# Patient Record
Sex: Female | Born: 1935 | Race: White | Hispanic: No | Smoking: Never smoker
Health system: Southern US, Community
[De-identification: ages and names within clinical notes are randomized; demographics above are authoritative.]

## PROBLEM LIST (undated history)

## (undated) DIAGNOSIS — J209 Acute bronchitis, unspecified: Secondary | ICD-10-CM

## (undated) DIAGNOSIS — K5900 Constipation, unspecified: Secondary | ICD-10-CM

## (undated) DIAGNOSIS — L03119 Cellulitis of unspecified part of limb: Secondary | ICD-10-CM

## (undated) DIAGNOSIS — J309 Allergic rhinitis, unspecified: Secondary | ICD-10-CM

## (undated) DIAGNOSIS — I10 Essential (primary) hypertension: Secondary | ICD-10-CM

## (undated) DIAGNOSIS — R32 Unspecified urinary incontinence: Secondary | ICD-10-CM

## (undated) DIAGNOSIS — M810 Age-related osteoporosis without current pathological fracture: Secondary | ICD-10-CM

## (undated) DIAGNOSIS — K219 Gastro-esophageal reflux disease without esophagitis: Secondary | ICD-10-CM

## (undated) DIAGNOSIS — K279 Peptic ulcer, site unspecified, unspecified as acute or chronic, without hemorrhage or perforation: Secondary | ICD-10-CM

## (undated) DIAGNOSIS — R42 Dizziness and giddiness: Secondary | ICD-10-CM

## (undated) DIAGNOSIS — R011 Cardiac murmur, unspecified: Secondary | ICD-10-CM

## (undated) DIAGNOSIS — I214 Non-ST elevation (NSTEMI) myocardial infarction: Secondary | ICD-10-CM

## (undated) DIAGNOSIS — G4700 Insomnia, unspecified: Secondary | ICD-10-CM

## (undated) DIAGNOSIS — R609 Edema, unspecified: Secondary | ICD-10-CM

## (undated) DIAGNOSIS — I2510 Atherosclerotic heart disease of native coronary artery without angina pectoris: Secondary | ICD-10-CM

## (undated) DIAGNOSIS — R05 Cough: Secondary | ICD-10-CM

## (undated) DIAGNOSIS — R4182 Altered mental status, unspecified: Secondary | ICD-10-CM

## (undated) DIAGNOSIS — J019 Acute sinusitis, unspecified: Secondary | ICD-10-CM

## (undated) DIAGNOSIS — S22009A Unspecified fracture of unspecified thoracic vertebra, initial encounter for closed fracture: Secondary | ICD-10-CM

## (undated) DIAGNOSIS — R0602 Shortness of breath: Secondary | ICD-10-CM

## (undated) DIAGNOSIS — M546 Pain in thoracic spine: Secondary | ICD-10-CM

## (undated) DIAGNOSIS — M1990 Unspecified osteoarthritis, unspecified site: Secondary | ICD-10-CM

## (undated) DIAGNOSIS — J45909 Unspecified asthma, uncomplicated: Secondary | ICD-10-CM

## (undated) DIAGNOSIS — I4891 Unspecified atrial fibrillation: Secondary | ICD-10-CM

## (undated) DIAGNOSIS — R059 Cough, unspecified: Secondary | ICD-10-CM

## (undated) DIAGNOSIS — L02419 Cutaneous abscess of limb, unspecified: Secondary | ICD-10-CM

## (undated) DIAGNOSIS — N390 Urinary tract infection, site not specified: Secondary | ICD-10-CM

## (undated) HISTORY — DX: Unspecified urinary incontinence: R32

## (undated) HISTORY — DX: Cough, unspecified: R05.9

## (undated) HISTORY — DX: Cutaneous abscess of limb, unspecified: L02.419

## (undated) HISTORY — DX: Cardiac murmur, unspecified: R01.1

## (undated) HISTORY — DX: Cough: R05

## (undated) HISTORY — DX: Unspecified asthma, uncomplicated: J45.909

## (undated) HISTORY — DX: Age-related osteoporosis without current pathological fracture: M81.0

## (undated) HISTORY — DX: Gastro-esophageal reflux disease without esophagitis: K21.9

## (undated) HISTORY — DX: Non-ST elevation (NSTEMI) myocardial infarction: I21.4

## (undated) HISTORY — DX: Urinary tract infection, site not specified: N39.0

## (undated) HISTORY — DX: Allergic rhinitis, unspecified: J30.9

## (undated) HISTORY — DX: Unspecified atrial fibrillation: I48.91

## (undated) HISTORY — DX: Atherosclerotic heart disease of native coronary artery without angina pectoris: I25.10

## (undated) HISTORY — DX: Essential (primary) hypertension: I10

## (undated) HISTORY — DX: Peptic ulcer, site unspecified, unspecified as acute or chronic, without hemorrhage or perforation: K27.9

## (undated) HISTORY — DX: Acute sinusitis, unspecified: J01.90

## (undated) HISTORY — DX: Unspecified osteoarthritis, unspecified site: M19.90

## (undated) HISTORY — DX: Cellulitis of unspecified part of limb: L03.119

## (undated) HISTORY — DX: Insomnia, unspecified: G47.00

## (undated) HISTORY — DX: Edema, unspecified: R60.9

## (undated) HISTORY — PX: BREAST LUMPECTOMY: SHX2

## (undated) HISTORY — DX: Shortness of breath: R06.02

## (undated) HISTORY — DX: Pain in thoracic spine: M54.6

## (undated) HISTORY — DX: Dizziness and giddiness: R42

## (undated) HISTORY — DX: Unspecified fracture of unspecified thoracic vertebra, initial encounter for closed fracture: S22.009A

## (undated) HISTORY — PX: SHOULDER SURGERY: SHX246

## (undated) HISTORY — DX: Altered mental status, unspecified: R41.82

## (undated) HISTORY — DX: Acute bronchitis, unspecified: J20.9

## (undated) HISTORY — DX: Constipation, unspecified: K59.00

## (undated) HISTORY — PX: OTHER SURGICAL HISTORY: SHX169

---

## 2002-04-10 HISTORY — PX: COLONOSCOPY: SHX174

## 2005-06-22 ENCOUNTER — Ambulatory Visit: Payer: Self-pay | Admitting: Family Medicine

## 2005-07-20 ENCOUNTER — Ambulatory Visit: Payer: Self-pay | Admitting: Internal Medicine

## 2005-11-09 ENCOUNTER — Encounter: Admission: RE | Admit: 2005-11-09 | Discharge: 2005-11-09 | Payer: Self-pay | Admitting: Family Medicine

## 2005-12-07 ENCOUNTER — Ambulatory Visit: Payer: Self-pay | Admitting: Family Medicine

## 2006-11-12 ENCOUNTER — Encounter: Admission: RE | Admit: 2006-11-12 | Discharge: 2006-11-12 | Payer: Self-pay | Admitting: Family Medicine

## 2006-11-14 ENCOUNTER — Encounter: Payer: Self-pay | Admitting: Family Medicine

## 2006-11-19 DIAGNOSIS — M810 Age-related osteoporosis without current pathological fracture: Secondary | ICD-10-CM | POA: Insufficient documentation

## 2006-11-19 DIAGNOSIS — K219 Gastro-esophageal reflux disease without esophagitis: Secondary | ICD-10-CM

## 2006-12-11 ENCOUNTER — Ambulatory Visit: Payer: Self-pay | Admitting: Family Medicine

## 2006-12-12 LAB — CONVERTED CEMR LAB
Basophils Relative: 0.5 % (ref 0.0–1.0)
CO2: 31 meq/L (ref 19–32)
Calcium: 9.5 mg/dL (ref 8.4–10.5)
Chloride: 106 meq/L (ref 96–112)
GFR calc non Af Amer: 49 mL/min
HCT: 34.3 % — ABNORMAL LOW (ref 36.0–46.0)
Hemoglobin: 11.8 g/dL — ABNORMAL LOW (ref 12.0–15.0)
LDL Cholesterol: 110 mg/dL — ABNORMAL HIGH (ref 0–99)
Monocytes Relative: 9.1 % (ref 3.0–11.0)
Neutro Abs: 4.9 10*3/uL (ref 1.4–7.7)
Neutrophils Relative %: 68.6 % (ref 43.0–77.0)
Platelets: 313 10*3/uL (ref 150–400)
Potassium: 4.6 meq/L (ref 3.5–5.1)
RDW: 14 % (ref 11.5–14.6)
Sodium: 142 meq/L (ref 135–145)
TSH: 4.01 microintl units/mL (ref 0.35–5.50)
Triglycerides: 115 mg/dL (ref 0–149)

## 2007-03-05 ENCOUNTER — Telehealth: Payer: Self-pay | Admitting: Family Medicine

## 2007-03-06 ENCOUNTER — Ambulatory Visit: Payer: Self-pay | Admitting: Family Medicine

## 2007-03-06 DIAGNOSIS — K279 Peptic ulcer, site unspecified, unspecified as acute or chronic, without hemorrhage or perforation: Secondary | ICD-10-CM | POA: Insufficient documentation

## 2007-03-06 DIAGNOSIS — L02419 Cutaneous abscess of limb, unspecified: Secondary | ICD-10-CM | POA: Insufficient documentation

## 2007-03-06 DIAGNOSIS — R609 Edema, unspecified: Secondary | ICD-10-CM

## 2007-03-06 DIAGNOSIS — L03119 Cellulitis of unspecified part of limb: Secondary | ICD-10-CM

## 2007-03-06 DIAGNOSIS — M1990 Unspecified osteoarthritis, unspecified site: Secondary | ICD-10-CM | POA: Insufficient documentation

## 2007-06-28 ENCOUNTER — Telehealth: Payer: Self-pay | Admitting: Family Medicine

## 2007-09-10 ENCOUNTER — Ambulatory Visit: Payer: Self-pay | Admitting: Family Medicine

## 2007-09-10 DIAGNOSIS — M546 Pain in thoracic spine: Secondary | ICD-10-CM

## 2007-11-27 ENCOUNTER — Encounter: Admission: RE | Admit: 2007-11-27 | Discharge: 2007-11-27 | Payer: Self-pay | Admitting: Family Medicine

## 2007-12-12 ENCOUNTER — Ambulatory Visit: Payer: Self-pay | Admitting: Family Medicine

## 2007-12-12 DIAGNOSIS — I10 Essential (primary) hypertension: Secondary | ICD-10-CM | POA: Insufficient documentation

## 2007-12-17 LAB — CONVERTED CEMR LAB
ALT: 11 units/L (ref 0–35)
AST: 19 units/L (ref 0–37)
Albumin: 3.9 g/dL (ref 3.5–5.2)
BUN: 20 mg/dL (ref 6–23)
Basophils Absolute: 0 10*3/uL (ref 0.0–0.1)
CO2: 30 meq/L (ref 19–32)
Calcium: 9.4 mg/dL (ref 8.4–10.5)
Chloride: 105 meq/L (ref 96–112)
Cholesterol: 199 mg/dL (ref 0–200)
Creatinine, Ser: 1.2 mg/dL (ref 0.4–1.2)
Eosinophils Relative: 1 % (ref 0.0–5.0)
GFR calc Af Amer: 54 mL/min
HDL: 66 mg/dL (ref >39.0)
MCV: 87.8 fL (ref 78.0–100.0)
Monocytes Relative: 10.5 % (ref 3.0–12.0)
Neutrophils Relative %: 63.9 % (ref 43.0–77.0)
RDW: 13.2 % (ref 11.5–14.6)
TSH: 3.27 microintl units/mL (ref 0.35–5.50)
VLDL: 12 mg/dL (ref 0–40)

## 2008-01-04 ENCOUNTER — Emergency Department (HOSPITAL_COMMUNITY): Admission: EM | Admit: 2008-01-04 | Discharge: 2008-01-04 | Payer: Self-pay | Admitting: Emergency Medicine

## 2008-01-04 ENCOUNTER — Telehealth (INDEPENDENT_AMBULATORY_CARE_PROVIDER_SITE_OTHER): Payer: Self-pay | Admitting: *Deleted

## 2008-01-06 ENCOUNTER — Telehealth: Payer: Self-pay | Admitting: Family Medicine

## 2008-01-13 ENCOUNTER — Ambulatory Visit: Payer: Self-pay | Admitting: Family Medicine

## 2008-01-13 DIAGNOSIS — R0602 Shortness of breath: Secondary | ICD-10-CM

## 2008-01-27 ENCOUNTER — Ambulatory Visit: Payer: Self-pay | Admitting: Family Medicine

## 2008-01-27 DIAGNOSIS — J209 Acute bronchitis, unspecified: Secondary | ICD-10-CM

## 2008-01-31 ENCOUNTER — Telehealth: Payer: Self-pay | Admitting: Family Medicine

## 2008-02-04 ENCOUNTER — Ambulatory Visit: Payer: Self-pay | Admitting: Family Medicine

## 2008-02-04 DIAGNOSIS — R42 Dizziness and giddiness: Secondary | ICD-10-CM | POA: Insufficient documentation

## 2008-02-04 DIAGNOSIS — J019 Acute sinusitis, unspecified: Secondary | ICD-10-CM | POA: Insufficient documentation

## 2008-03-20 ENCOUNTER — Ambulatory Visit: Payer: Self-pay | Admitting: Family Medicine

## 2008-03-20 ENCOUNTER — Encounter: Payer: Self-pay | Admitting: Family Medicine

## 2008-03-20 DIAGNOSIS — S22009A Unspecified fracture of unspecified thoracic vertebra, initial encounter for closed fracture: Secondary | ICD-10-CM | POA: Insufficient documentation

## 2008-03-23 ENCOUNTER — Encounter: Admission: RE | Admit: 2008-03-23 | Discharge: 2008-03-23 | Payer: Self-pay | Admitting: Family Medicine

## 2008-03-31 ENCOUNTER — Encounter: Payer: Self-pay | Admitting: Family Medicine

## 2008-04-10 HISTORY — PX: VERTEBROPLASTY: SHX113

## 2008-04-13 ENCOUNTER — Encounter: Payer: Self-pay | Admitting: Family Medicine

## 2008-04-14 ENCOUNTER — Ambulatory Visit (HOSPITAL_COMMUNITY): Admission: RE | Admit: 2008-04-14 | Discharge: 2008-04-15 | Payer: Self-pay | Admitting: Neurosurgery

## 2008-06-09 ENCOUNTER — Ambulatory Visit: Payer: Self-pay | Admitting: Family Medicine

## 2008-06-09 DIAGNOSIS — J309 Allergic rhinitis, unspecified: Secondary | ICD-10-CM | POA: Insufficient documentation

## 2008-06-09 DIAGNOSIS — G4700 Insomnia, unspecified: Secondary | ICD-10-CM | POA: Insufficient documentation

## 2008-07-20 ENCOUNTER — Telehealth: Payer: Self-pay | Admitting: Family Medicine

## 2008-07-22 ENCOUNTER — Encounter: Payer: Self-pay | Admitting: Family Medicine

## 2008-07-24 ENCOUNTER — Emergency Department (HOSPITAL_COMMUNITY): Admission: EM | Admit: 2008-07-24 | Discharge: 2008-07-24 | Payer: Self-pay | Admitting: Emergency Medicine

## 2008-07-24 ENCOUNTER — Ambulatory Visit: Payer: Self-pay | Admitting: Family Medicine

## 2008-07-24 LAB — CONVERTED CEMR LAB
CO2: 28 meq/L (ref 19–32)
GFR calc non Af Amer: 29.74 mL/min (ref >60)
Glucose, Bld: 97 mg/dL (ref 70–99)
Sodium: 143 meq/L (ref 135–145)

## 2008-07-27 ENCOUNTER — Encounter: Admission: RE | Admit: 2008-07-27 | Discharge: 2008-07-27 | Payer: Self-pay | Admitting: Ophthalmology

## 2008-09-01 ENCOUNTER — Ambulatory Visit: Payer: Self-pay | Admitting: Family Medicine

## 2008-09-01 DIAGNOSIS — K5900 Constipation, unspecified: Secondary | ICD-10-CM | POA: Insufficient documentation

## 2008-09-16 ENCOUNTER — Ambulatory Visit: Payer: Self-pay | Admitting: Family Medicine

## 2008-09-16 DIAGNOSIS — R32 Unspecified urinary incontinence: Secondary | ICD-10-CM | POA: Insufficient documentation

## 2008-10-09 ENCOUNTER — Ambulatory Visit: Payer: Self-pay | Admitting: Family Medicine

## 2008-12-01 ENCOUNTER — Encounter: Admission: RE | Admit: 2008-12-01 | Discharge: 2008-12-01 | Payer: Self-pay | Admitting: Family Medicine

## 2008-12-18 ENCOUNTER — Telehealth: Payer: Self-pay | Admitting: Family Medicine

## 2009-01-19 ENCOUNTER — Ambulatory Visit: Payer: Self-pay | Admitting: Family Medicine

## 2009-01-19 DIAGNOSIS — J45909 Unspecified asthma, uncomplicated: Secondary | ICD-10-CM | POA: Insufficient documentation

## 2009-01-20 LAB — CONVERTED CEMR LAB
ALT: 10 units/L (ref 0–35)
AST: 17 units/L (ref 0–37)
Albumin: 3.7 g/dL (ref 3.5–5.2)
Alkaline Phosphatase: 53 units/L (ref 39–117)
BUN: 25 mg/dL — ABNORMAL HIGH (ref 6–23)
Basophils Relative: 0.7 % (ref 0.0–3.0)
Calcium: 9.3 mg/dL (ref 8.4–10.5)
Eosinophils Relative: 1.3 % (ref 0.0–5.0)
GFR calc non Af Amer: 37.17 mL/min (ref >60)
Glucose, Bld: 87 mg/dL (ref 70–99)
HCT: 32.7 % — ABNORMAL LOW (ref 36.0–46.0)
HDL: 57.7 mg/dL (ref >39.00)
Lymphocytes Relative: 19.8 % (ref 12.0–46.0)
Monocytes Relative: 9.2 % (ref 3.0–12.0)
Neutrophils Relative %: 69 % (ref 43.0–77.0)
RDW: 13.5 % (ref 11.5–14.6)
Sodium: 135 meq/L (ref 135–145)
Total Bilirubin: 0.7 mg/dL (ref 0.3–1.2)
Total Protein: 7 g/dL (ref 6.0–8.3)
Triglycerides: 59 mg/dL (ref 0.0–149.0)
VLDL: 11.8 mg/dL (ref 0.0–40.0)
WBC: 6.8 10*3/uL (ref 4.5–10.5)

## 2009-02-09 ENCOUNTER — Encounter (INDEPENDENT_AMBULATORY_CARE_PROVIDER_SITE_OTHER): Payer: Self-pay | Admitting: *Deleted

## 2009-03-27 ENCOUNTER — Emergency Department (HOSPITAL_COMMUNITY): Admission: EM | Admit: 2009-03-27 | Discharge: 2009-03-27 | Payer: Self-pay | Admitting: Emergency Medicine

## 2009-05-12 ENCOUNTER — Ambulatory Visit: Payer: Self-pay | Admitting: Family Medicine

## 2009-08-18 ENCOUNTER — Telehealth: Payer: Self-pay | Admitting: Family Medicine

## 2009-08-31 ENCOUNTER — Telehealth: Payer: Self-pay | Admitting: Family Medicine

## 2009-09-30 IMAGING — CR DG RIBS 2V*L*
3 series · 3 of 3 positions shown · non-contrast
Comparison: 01/04/2008 chest radiograph

CLINICAL DATA: Left side pain.  Fell 2 weeks ago.

LEFT RIBS - 2 VIEW

[view not recorded (1 of 3)]
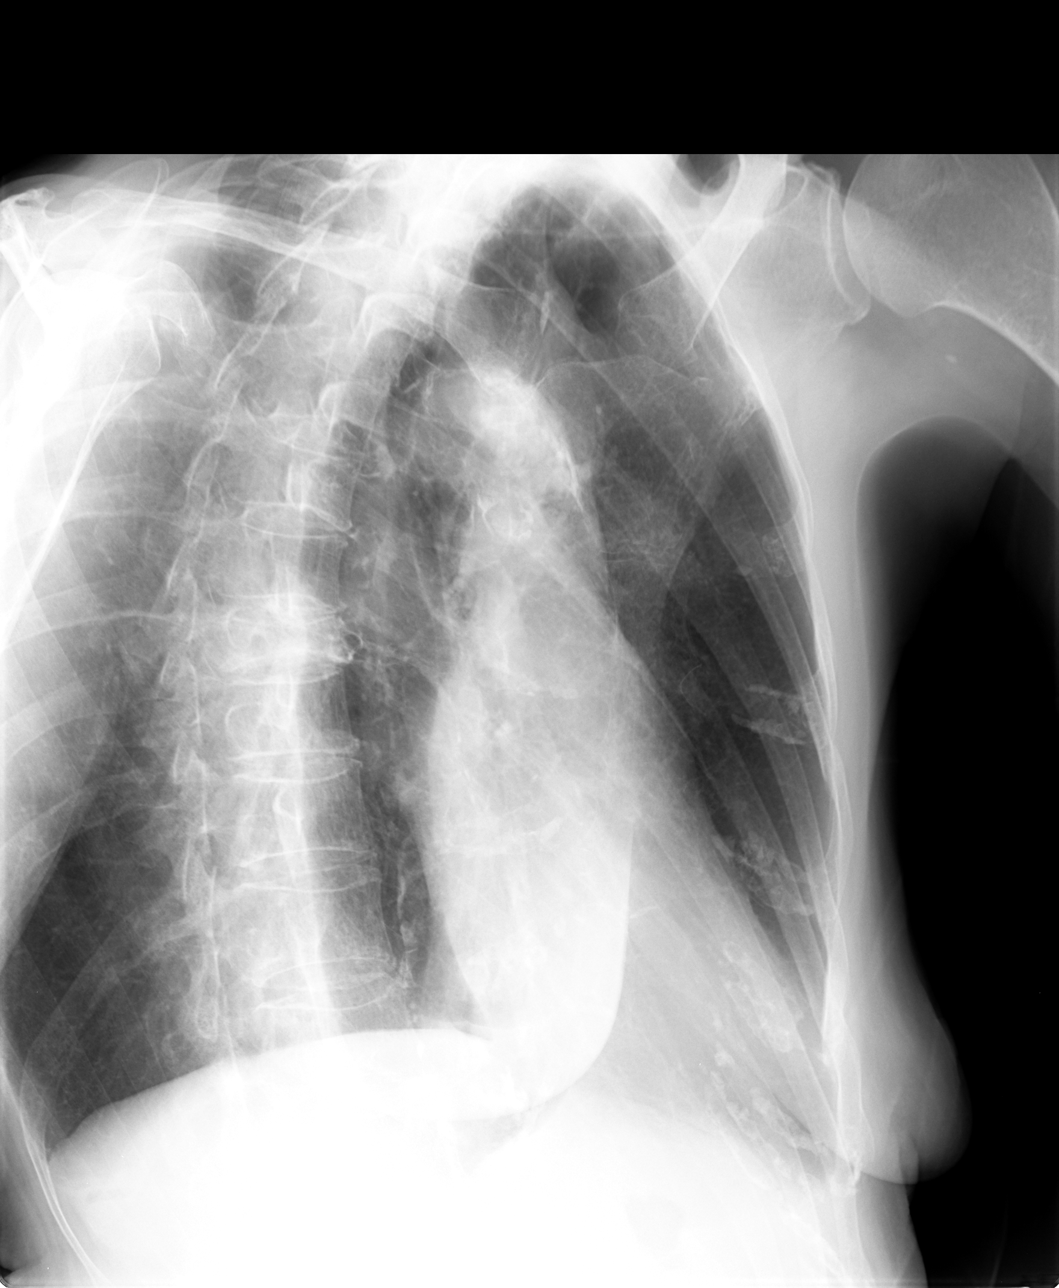

[view not recorded (2 of 3)]
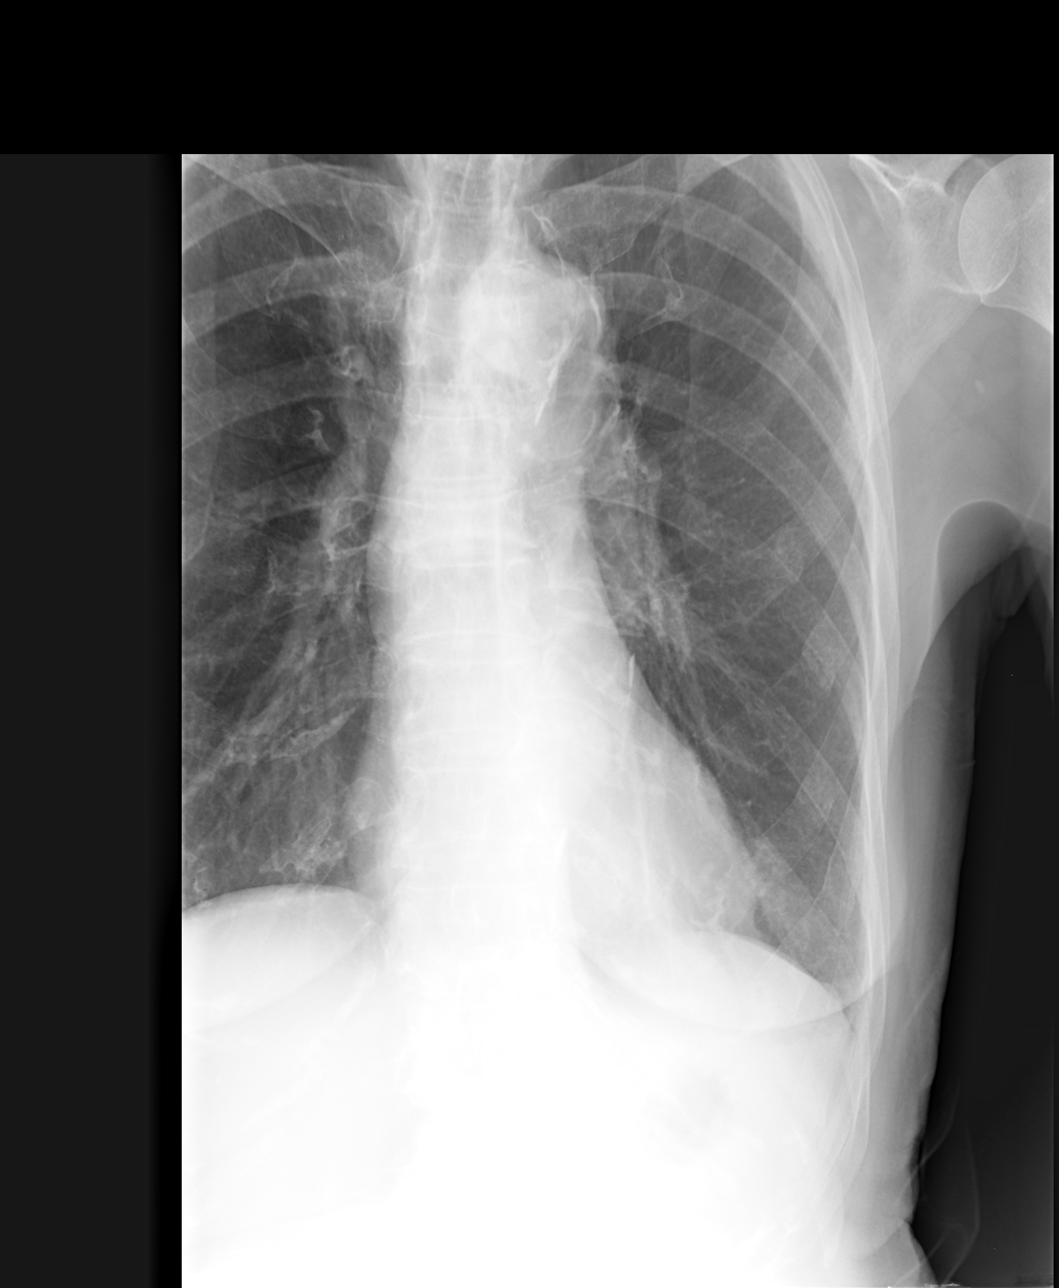

[view not recorded (3 of 3)]
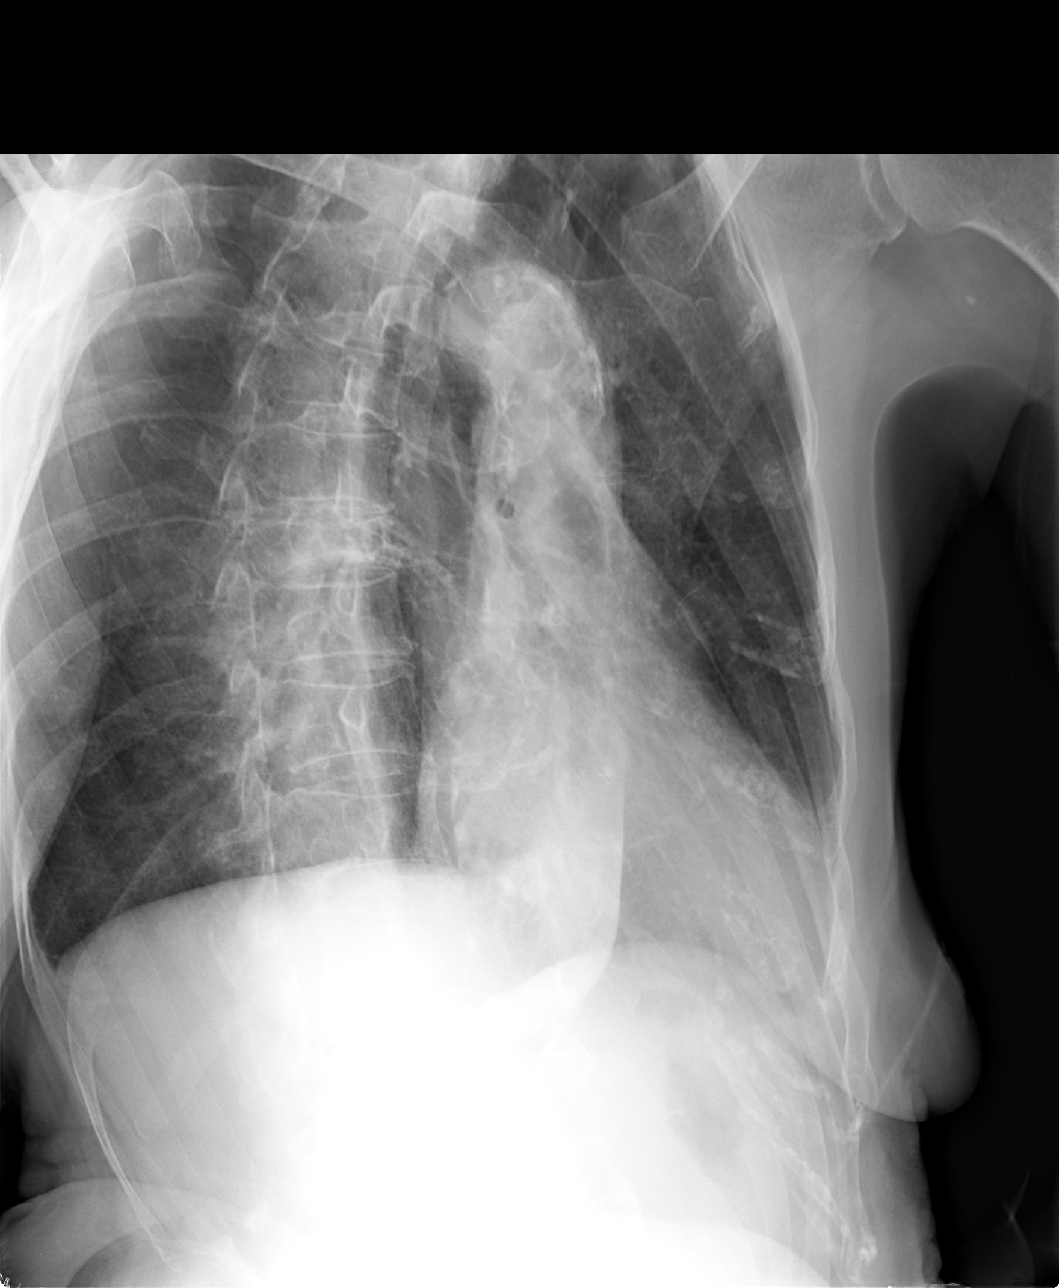

[3 of 3 positions shown; findings below may reference images not displayed]

FINDINGS: Multiple remote healing left rib fractures.  No acute
fracture.  T8 compression fracture represents a new finding.
IMPRESSION: Acute/subacute T8 compression fracture.  The vertebrae is
compressed down to approximately one half of its normal height.
Consider plain radiographs of the thoracic spine for further
evaluation.  Remote left rib fractures.  No acute rib fracture.

## 2009-10-06 ENCOUNTER — Telehealth: Payer: Self-pay | Admitting: Family Medicine

## 2009-10-26 ENCOUNTER — Telehealth: Payer: Self-pay | Admitting: Family Medicine

## 2009-10-30 ENCOUNTER — Ambulatory Visit: Payer: Self-pay | Admitting: Family Medicine

## 2009-10-30 DIAGNOSIS — N390 Urinary tract infection, site not specified: Secondary | ICD-10-CM

## 2009-10-30 LAB — CONVERTED CEMR LAB
Blood in Urine, dipstick: NEGATIVE
Glucose, Urine, Semiquant: NEGATIVE
Ketones, urine, test strip: NEGATIVE
Nitrite: NEGATIVE
Specific Gravity, Urine: <1.005
Urobilinogen, UA: 0.2
WBC Urine, dipstick: NEGATIVE
pH: 5

## 2009-11-01 ENCOUNTER — Telehealth (INDEPENDENT_AMBULATORY_CARE_PROVIDER_SITE_OTHER): Payer: Self-pay

## 2009-11-02 ENCOUNTER — Ambulatory Visit: Payer: Self-pay | Admitting: Family Medicine

## 2009-11-02 ENCOUNTER — Ambulatory Visit: Payer: Self-pay | Admitting: Cardiology

## 2009-11-02 ENCOUNTER — Inpatient Hospital Stay (HOSPITAL_COMMUNITY): Admission: EM | Admit: 2009-11-02 | Discharge: 2009-11-05 | Payer: Self-pay | Admitting: Emergency Medicine

## 2009-11-03 ENCOUNTER — Encounter: Payer: Self-pay | Admitting: Cardiology

## 2009-12-03 DIAGNOSIS — I4891 Unspecified atrial fibrillation: Secondary | ICD-10-CM | POA: Insufficient documentation

## 2009-12-03 DIAGNOSIS — I252 Old myocardial infarction: Secondary | ICD-10-CM | POA: Insufficient documentation

## 2009-12-03 DIAGNOSIS — R4182 Altered mental status, unspecified: Secondary | ICD-10-CM

## 2009-12-10 ENCOUNTER — Ambulatory Visit: Payer: Self-pay | Admitting: Cardiology

## 2009-12-14 LAB — CONVERTED CEMR LAB
CO2: 27 meq/L (ref 19–32)
Calcium: 9 mg/dL (ref 8.4–10.5)
Chloride: 97 meq/L (ref 96–112)
GFR calc non Af Amer: 48.5 mL/min (ref >60)

## 2009-12-31 ENCOUNTER — Telehealth: Payer: Self-pay | Admitting: Family Medicine

## 2010-01-05 ENCOUNTER — Encounter: Admission: RE | Admit: 2010-01-05 | Discharge: 2010-01-05 | Payer: Self-pay | Admitting: Family Medicine

## 2010-02-11 ENCOUNTER — Telehealth: Payer: Self-pay | Admitting: Family Medicine

## 2010-03-31 ENCOUNTER — Inpatient Hospital Stay (HOSPITAL_COMMUNITY)
Admission: EM | Admit: 2010-03-31 | Discharge: 2010-04-01 | Payer: Self-pay | Source: Home / Self Care | Attending: Cardiology | Admitting: Cardiology

## 2010-04-26 ENCOUNTER — Encounter (INDEPENDENT_AMBULATORY_CARE_PROVIDER_SITE_OTHER): Payer: Self-pay | Admitting: *Deleted

## 2010-04-27 ENCOUNTER — Ambulatory Visit
Admission: RE | Admit: 2010-04-27 | Discharge: 2010-04-27 | Payer: Self-pay | Source: Home / Self Care | Attending: Cardiology | Admitting: Cardiology

## 2010-04-27 ENCOUNTER — Encounter: Payer: Self-pay | Admitting: Cardiology

## 2010-04-27 DIAGNOSIS — R05 Cough: Secondary | ICD-10-CM | POA: Insufficient documentation

## 2010-04-27 DIAGNOSIS — I2510 Atherosclerotic heart disease of native coronary artery without angina pectoris: Secondary | ICD-10-CM | POA: Insufficient documentation

## 2010-05-05 NOTE — H&P (Addendum)
Debra Velasquez, PISCOPO NO.:  [id number]  MEDICAL RECORD NO.:  [id number]          PATIENT TYPE:  INP  LOCATION:  2037                         FACILITY:  [facility]  PHYSICIAN:  Colleen Can. Debra Velasquez, M.D.DATE OF BIRTH:  [birth year]  DATE OF ADMISSION:  03/31/2010 DATE OF DISCHARGE:                             HISTORY & PHYSICAL   PRIMARY CARDIOLOGIST:  Maisie Fus C. Daleen Squibb, MD, [facility]  PRIMARY CARE PHYSICIAN:  Tera Mater. Clent Ridges, MD  CHIEF COMPLAINT:  Chest pain.  HISTORY OF PRESENT ILLNESS:  This is a 77 year old female with a history of non-ST-elevation myocardial infarction in July 2011, paroxysmal atrial fibrillation, and hypertension who states she was in her usual state of health until last evening where she began to experience substernal chest pain that radiated to the mid scapula.  She denies any associated symptoms of nausea, vomiting, or dyspnea.  The patient tried to relieve the pain with sublingual nitroglycerin this morning and this did not aide in her discomfort.  She presented to the emergency department for further evaluation.  The patient's pain has now been present for more than 12 hours.  The patient's initial set of cardiac markers in the emergency department were negative.  She also has EKG without acute changes.  Of note, the patient does exercise regularly without discomfort or increased dyspnea.  She has had no change in her routine.  She also has a history of gastroesophageal reflux disease, which she is on chronic Nexium therapy for.  In reviewing the patient's record, it appears that the patient was admitted for hyponatremia actually in July and was taken off her hydrochlorothiazide at this time and that was felt to be the culprit. Her recent an outpatient medication list does contain hydrochlorothiazide/lisinopril.  Again, her hydrochlorothiazide will be held.  PAST MEDICAL HISTORY: 1. Non-ST-segment elevation myocardial infarction in July 2011.     a.     Echocardiogram in July 2011 demonstrating no regional wall      motion abnormalities, but ejection fraction of 55% to 60%. 2. Paroxysmal atrial fibrillation.  Considered not a good candidate     for Coumadin therapy and is on aspirin therapy. 3. Hypertension. 4. Gastroesophageal reflux disease. 5. Asthma. 6. Osteoarthritis. 7. Osteoporosis. 8. Peptic ulcer disease. 9. Recurrent urinary tract infections. 10.Urinary incontinence. 11.Status post shoulder surgery. 12.Status post lumpectomy. 13.Status post ureteral obstruction surgery. 14.Status post colonoscopy in 2004. 15.Status post vertebroplasty.  SOCIAL HISTORY:  The patient lives in assisted living facility in [place], [place].  The patient denies any tobacco, alcohol, or illicit drug use.  She tries to walk regularly.  FAMILY HISTORY:  Noncontributory secondary to the patient's age.  ALLERGIES:  No known drug allergies.  OUTPATIENT MEDICATIONS: 1. Vesicare 5 mg 1 tablet daily. 2. Nasonex 15 mcg 2 sprays each nostril daily as needed. 3. Lisinopril/hydrochlorothiazide 20/12.5 one tablet daily. 4. Vicodin 5/500 one tablet every 6 hours as needed. 5. Crestor 10 mg 1 tablet every evening. 6. Nitroglycerin 0.4 mg sublingual as needed for chest pain. 7. Nexium 40 mg 1 tablet daily. 8. Aspirin 81 mg daily. 9. Fosamax 35 mg 1 tablet  weekly. 10.Metoprolol-XL succinate 50 mg 1 tablet daily.  REVIEW OF SYSTEMS:  All pertinent positives and negatives as stated in HPI.  All other systems have been reviewed and are negative.  PHYSICAL EXAMINATION:  VITAL SIGNS:  Temperature 98.2, pulse 61, respirations 16, blood pressure 143-183 over 62-94. GENERAL:  This is a polite elderly female.  She is in no acute distress. The patient is extremely alert for her age. HEENT:  Normal. NECK:  Supple without bruit or JVD. HEART:  Regular rate and rhythm with S1 and S2.  There is a harsh murmur noted.  Pulses are 2+ and equal  bilaterally. LUNGS:  Clear to auscultation bilaterally without wheezes, rales, or rhonchi. ABDOMEN:  Soft, nontender, positive bowel sounds x4. EXTREMITIES:  No clubbing, cyanosis, or edema. NEURO:  Alert and oriented x3, cranial nerves II through XII grossly intact.  Chest x-ray showing COPD, but no acute cardiopulmonary process.  EKG showed normal sinus rhythm at a rate of 72 beats per minute.  Axis is normal.  Intervals are normal.  There is left ventricular hypertrophy and that appears unchanged from previous tracings.  LABORATORY DATA:  WBC 5.9, hemoglobin 10.2, hematocrit 30.9, platelets 245.  Sodium 131, potassium 3.9, chloride 99, bicarb 27, BUN 16, creatinine 1.21.  Cardiac enzymes are negative x1.  ASSESSMENT/PLAN:  This is a 77 year old female with recent non-ST- elevation myocardial infarction, hypertension, and gastroesophageal reflux disease who presents with chest pain.  Symptoms appear typical and may be related to reflux.  We will initially continue conservative management, admit the patient to rule out for myocardial infarction by cycling cardiac enzymes.  If these remain negative, then no further invasive workup will be needed.  Question with the patient's chronic gastroesophageal reflux disease if there is a benefit to remain on Fosamax.  This should be discussed with her primary care physician.  The patient will be continued on her home medications.  Further treatment will be dependent upon these results.     Leonette Monarch, PA-C   ______________________________ Colleen Can Debra Velasquez, M.D.    NB/MEDQ  D:  03/31/2010  T:  04/01/2010  Job:  [id number]  Electronically Signed by Alen Blew P.A. on 04/19/2010 08:40:01 PM Electronically Signed by Roger Shelter M.D. on 05/05/2010 03:35:20 PM

## 2010-05-10 NOTE — Progress Notes (Signed)
Summary: new rx  Phone Note Call from Patient Call back at Home Phone [phone]   Caller: Daughter-doris Call For: Debra Salisbury MD Summary of Call: pt needs new rx temazepam cap 15 mg #90 fax to [facility] [phone] Initial call taken by: Heron Sabins,  Aug 31, 2009 2:19 PM  Follow-up for Phone Call        done, please fax Follow-up by: Debra Salisbury MD,  Aug 31, 2009 3:50 PM  Additional Follow-up for Phone Call Additional follow up Details #1::        faxed Additional Follow-up by: Raechel Ache, RN,  Aug 31, 2009 3:56 PM    Prescriptions: TEMAZEPAM 15 MG CAPS (TEMAZEPAM) at bedtime  #90 x 1   Entered and Authorized by:   Debra Salisbury MD   Signed by:   Debra Salisbury MD on 08/31/2009   Method used:   Print then Give to Patient   RxID:   [id number]

## 2010-05-10 NOTE — Progress Notes (Signed)
Summary: REFILL REQUEST nasonex  Phone Note Refill Request Message from:  Patient's daughter  [phone] on December 31, 2009 9:24 AM  Refills Requested: Medication #1:  NASONEX 50 MCG/ACT SUSP 2 sprays each nostril once daily   Notes: [facility].    Initial call taken by: Debbra Riding,  December 31, 2009 9:25 AM  Follow-up for Phone Call        done  daughter aware.  Follow-up by: Pura Spice, RN,  December 31, 2009 12:22 PM    New/Updated Medications: NASONEX 50 MCG/ACT SUSP (MOMETASONE FUROATE) 2 sprays each nostril once daily Prescriptions: NASONEX 50 MCG/ACT SUSP (MOMETASONE FUROATE) 2 sprays each nostril once daily  #3 x 3   Entered by:   Pura Spice, RN   Authorized by:   [name] MD   Signed by:   Pura Spice, RN on 12/31/2009   Method used:   Electronically to        [facility]* (retail)             ,          Ph: [phone]       Fax: [phone]   RxID:   [id number]

## 2010-05-10 NOTE — Assessment & Plan Note (Signed)
Summary: pt fell/njr   Vital Signs:  Patient profile:   77 year old female O2 Sat:      98 % on Room air Temp:     97.9 degrees F oral Pulse rate:   84 / minute Pulse rhythm:   irregularly irregular BP sitting:   120 / 68  (left arm) Cuff size:   regular  Vitals Entered By: Raechel Ache, RN (November 02, 2009 11:28 AM)  O2 Flow:  Room air CC: C/o feeling sick all over. Was seen at Sat clinic and treated with Cipro for UTI (hard time voiding). Last night was found on floor with nausea and vomiting.   History of Present Illness: 77 yr old female here with her son-in-law after she was found on the floor of her assisted living facility at 2:30 this morning. She had pushed a call help button, and the staff responded. She does not remember  falling however. Started feeling bad last night and continues to feel bad today. She is weak, shaky, and very nauseated, although she has not vomitted. She has a mild lower abdominal pain. No fever. No chest pain or SOB. She was seen last weekend for urinary urgency and burning, was found to have a UTI, and was given a 3 day course of Cipro. She did finish this, and in fact the urgency and burning went away.   Allergies: No Known Drug Allergies  Past History:  Past Medical History: GERD Osteoporosis, last DEXA 08-11-05 Osteoarthritis phlebitis Peptic ulcer disease UTI's heart murmur vertebral compression fractures Hypertension Asthma Urinary incontinence  Past Surgical History: Reviewed history from 01/19/2009 and no changes required. shoulder surgery Lumpectomy ureter obstruction surgery colonoscopy 2004, all normal vertebroplasty to T8 per Dr. Venetia Maxon 2010  Review of Systems  The patient denies anorexia, fever, weight loss, weight gain, vision loss, decreased hearing, hoarseness, chest pain, syncope, dyspnea on exertion, peripheral edema, prolonged cough, headaches, hemoptysis, melena, hematochezia, severe indigestion/heartburn, hematuria,  incontinence, genital sores, muscle weakness, suspicious skin lesions, transient blindness, difficulty walking, depression, unusual weight change, abnormal bleeding, enlarged lymph nodes, angioedema, and breast masses.    Physical Exam  General:  alert but very weak, in a wheelchair. Requires assistance to get on the exam table.  Neck:  No deformities, masses, or tenderness noted. Lungs:  Normal respiratory effort, chest expands symmetrically. Lungs are clear to auscultation, no crackles or wheezes. Heart:  irregularly irregular rhythm. normal rate, no murmur, no gallop, and no rub.  EKG shows new onset atrial fibrillation with a controlled ventricular rate. Long QTc interval. No acute ST changes.  Abdomen:  soft, normal bowel sounds, no distention, no masses, no guarding, no rigidity, no rebound tenderness, no abdominal hernia, no inguinal hernia, no hepatomegaly, and no splenomegaly.  Mildly tender above the pubis Pulses:  R and L carotid,radial,femoral,dorsalis pedis and posterior tibial pulses are full and equal bilaterally Extremities:  no edema. Neurologic:  alert & oriented X3 and cranial nerves II-XII intact.   Skin:  Intact without suspicious lesions or rashes Psych:  Oriented X3, normally interactive, and good eye contact.     Impression & Recommendations:  Problem # 1:  ATRIAL FIBRILLATION (ICD-427.31)  Her updated medication list for this problem includes:    Aspirin 81 Mg Tbec (Aspirin) ..... Once daily    Metoprolol Succinate 50 Mg Xr24h-tab (Metoprolol succinate) ..... Once daily  Orders: EKG w/ Interpretation (93000)  Problem # 2:  UTI (ICD-599.0)  Her updated medication list for this problem includes:  Vesicare 10 Mg Tabs (Solifenacin succinate) ..... Once daily    Ciprofloxacin Hcl 250 Mg Tabs (Ciprofloxacin hcl) .Marland Kitchen... 1 tab by mouth two times a day x 3 days  Problem # 3:  ASTHMA (ICD-493.90)  Her updated medication list for this problem includes:    Ventolin  Hfa 108 (90 Base) Mcg/act Aers (Albuterol sulfate) .Marland Kitchen... 2 puffs every 4 hours as needed  Problem # 4:  HYPERTENSION (ICD-401.9)  Her updated medication list for this problem includes:    Lisinopril-hydrochlorothiazide 20-12.5 Mg Tabs (Lisinopril-hydrochlorothiazide) .Marland Kitchen... 1 tablet by mouth daily    Metoprolol Succinate 50 Mg Xr24h-tab (Metoprolol succinate) ..... Once daily  Problem # 5:  PEPTIC ULCER DISEASE (ICD-533.90)  Her updated medication list for this problem includes:    Nexium 40 Mg Cpdr (Esomeprazole magnesium) .Marland Kitchen... Take 1 capsule by mouth once a day  Problem # 6:  OSTEOARTHRITIS (ICD-715.90)  Her updated medication list for this problem includes:    Aspirin 81 Mg Tbec (Aspirin) ..... Once daily    Vicodin 5-500 Mg Tabs (Hydrocodone-acetaminophen) .Marland Kitchen... 1 every 6 hours as needed pain  Complete Medication List: 1)  Fosamax 35 Mg Tabs (Alendronate sodium) .... Take 1 tablet by mouth once a week 2)  Nexium 40 Mg Cpdr (Esomeprazole magnesium) .... Take 1 capsule by mouth once a day 3)  Aspirin 81 Mg Tbec (Aspirin) .... Once daily 4)  Vicodin 5-500 Mg Tabs (Hydrocodone-acetaminophen) .Marland Kitchen.. 1 every 6 hours as needed pain 5)  Ventolin Hfa 108 (90 Base) Mcg/act Aers (Albuterol sulfate) .... 2 puffs every 4 hours as needed 6)  Nasonex 50 Mcg/act Susp (Mometasone furoate) .... 2 sprays each nostril once daily 7)  Lisinopril-hydrochlorothiazide 20-12.5 Mg Tabs (Lisinopril-hydrochlorothiazide) .Marland Kitchen.. 1 tablet by mouth daily 8)  Metoprolol Succinate 50 Mg Xr24h-tab (Metoprolol succinate) .... Once daily 9)  Vesicare 10 Mg Tabs (Solifenacin succinate) .... Once daily 10)  Temazepam 15 Mg Caps (Temazepam) .... At bedtime 11)  Ciprofloxacin Hcl 250 Mg Tabs (Ciprofloxacin hcl) .Marland Kitchen.. 1 tab by mouth two times a day x 3 days  Patient Instructions: 1)  New onset atrial fibrillation after recent treatment for a UTI. Will transport via EMS to [facility] for evaluation and treatment.

## 2010-05-10 NOTE — Progress Notes (Signed)
Summary: refill  Phone Note Refill Request Message from:  daughter---walked in  Refills Requested: Medication #1:  VICODIN 5-500 MG TABS 1 every 6 hours as needed pain send to [facility]  Initial call taken by: Warnell Forester,  October 06, 2009 11:33 AM Caller: Patient---live call  Follow-up for Phone Call        done in your box Follow-up by: [name] MD,  October 06, 2009 1:31 PM    New/Updated Medications: VICODIN 5-500 MG TABS (HYDROCODONE-ACETAMINOPHEN) 1 every 6 hours as needed pain Prescriptions: VICODIN 5-500 MG TABS (HYDROCODONE-ACETAMINOPHEN) 1 every 6 hours as needed pain  #360 x 1   Entered and Authorized by:   [name] MD   Signed by:   [name] MD on 10/06/2009   Method used:   Print then Give to Patient   RxID:   [id number]

## 2010-05-10 NOTE — Assessment & Plan Note (Signed)
Summary: UTI   Vital Signs:  Patient profile:   77 year old female O2 Sat:      96 % on Room air Temp:     97.9 degrees F oral Pulse rate:   86 / minute BP sitting:   172 / 78  (left arm) Cuff size:   regular  Vitals Entered By: Margaret Pyle, CMA (October 30, 2009 11:31 AM)  O2 Flow:  Room air CC: Urgency, burning when urinating/ DBD   Primary Care Provider:  [name] MD  CC:  Urgency and burning when urinating/ DBD.  History of Present Illness: 77 yo WF presents for burning with urination that started 2 days ago.  She denies urgency but has some frequency.  She has a hx of frequent UTI but it has been a while.  Denies any fevers.  Her daugther says that she has had a change in her short term memory.  She has been more tired.  She does not drink much water.  She denies N/V.  she denies any pelvic or flank pain.    Allergies (verified): No Known Drug Allergies  Past History:  Past Medical History: Reviewed history from 01/19/2009 and no changes required. GERD Osteoporosis, last DEXA 08-11-05 Osteoarthritis phlebitis Peptic ulcer disease murmur UTI's heart murmur vertebral compression fractures Hypertension Asthma Urinary incontinence  Social History: Retired Single Never Smoked Alcohol use-no Lives in [place] home. ALF  Review of Systems      See HPI  Physical Exam  General:  alert, well-developed, well-nourished, and well-hydrated.  appears younger than stated age; here with daughter Head:  normocephalic and atraumatic.   Mouth:  pharynx pink and moist.   Neck:  no masses.   Lungs:  Normal respiratory effort, chest expands symmetrically. Lungs are clear to auscultation, no crackles or wheezes. Heart:  Normal rate and regular rhythm. S1 and S2 normal without gallop, murmur, click, rub or other extra sounds. Abdomen:  Bowel sounds positive,abdomen soft and non-tender without masses, organomegaly or  Extremities:  no LE edema Skin:  color  normal.   Psych:  good eye contact, not anxious appearing, and not depressed appearing.     Impression & Recommendations:  Problem # 1:  UTI (ICD-599.0) UA inconclusive.  Sent for cx.  Will f/u results next wk.  Given age and mental status changes, will empirically start her on Cipro.  Increase intake of water.  Call if any worsening. Her updated medication list for this problem includes:    Vesicare 10 Mg Tabs (Solifenacin succinate) ..... Once daily    Ciprofloxacin Hcl 250 Mg Tabs (Ciprofloxacin hcl) .Marland Kitchen... 1 tab by mouth two times a day x 3 days  Orders: T-Culture, Urine ([address]-60454) UA Dipstick w/o Micro (manual) (09811)  Complete Medication List: 1)  Fosamax 35 Mg Tabs (Alendronate sodium) .... Take 1 tablet by mouth once a week 2)  Nexium 40 Mg Cpdr (Esomeprazole magnesium) .... Take 1 capsule by mouth once a day 3)  Aspirin 81 Mg Tbec (Aspirin) .... Once daily 4)  Vicodin 5-500 Mg Tabs (Hydrocodone-acetaminophen) .Marland Kitchen.. 1 every 6 hours as needed pain 5)  Ventolin Hfa 108 (90 Base) Mcg/act Aers (Albuterol sulfate) .... 2 puffs every 4 hours as needed 6)  Nasonex 50 Mcg/act Susp (Mometasone furoate) .... 2 sprays each nostril once daily 7)  Lisinopril-hydrochlorothiazide 20-12.5 Mg Tabs (Lisinopril-hydrochlorothiazide) .Marland Kitchen.. 1 tablet by mouth daily 8)  Metoprolol Succinate 50 Mg Xr24h-tab (Metoprolol succinate) .... Once daily 9)  Vesicare 10 Mg Tabs (  Solifenacin succinate) .... Once daily 10)  Temazepam 15 Mg Caps (Temazepam) .... At bedtime 11)  Ciprofloxacin Hcl 250 Mg Tabs (Ciprofloxacin hcl) .Marland Kitchen.. 1 tab by mouth two times a day x 3 days  Patient Instructions: 1)  Start on Cipro 2 x a day x 3 days for UTI. 2)  will call you with urine culture results Mon or Tuesday. 3)  Increase water/ cranberry juice intake and stay out of the heat.   Prescriptions: CIPROFLOXACIN HCL 250 MG TABS (CIPROFLOXACIN HCL) 1 tab by mouth two times a day x 3 days  #6 x 0   Entered and Authorized by:    Seymour Bars DO   Signed by:   Seymour Bars DO on 10/30/2009   Method used:   Electronically to        [facility]* (retail)       [address]       [place], [place]  [address]       Ph: [phone] or [phone]       Fax: [phone]   RxID:   [id number]   Laboratory Results   Urine Tests    Routine Urinalysis   Color: orange Appearance: Hazy Glucose: negative   (Normal Range: Negative) Bilirubin: small   (Normal Range: Negative) Ketone: negative   (Normal Range: Negative) Spec. Gravity: <1.005   (Normal Range: 1.003-1.035) Blood: negative   (Normal Range: Negative) pH: 5.0   (Normal Range: 5.0-8.0) Protein: trace   (Normal Range: Negative) Urobilinogen: 0.2   (Normal Range: 0-1) Nitrite: negative   (Normal Range: Negative) Leukocyte Esterace: negative   (Normal Range: Negative)

## 2010-05-10 NOTE — Assessment & Plan Note (Signed)
Summary: PNEUMONIA INJ // RS  Nurse Visit   Allergies: No Known Drug Allergies  Immunizations Administered:  Pneumonia Vaccine:    Vaccine Type: Pneumovax (Medicare)    Site: left deltoid    Mfr: Merck    Dose: 0.5 ml    Route: IM    Given by: Alfred Levins, CMA    Exp. Date: 07/29/2010    Lot #: [id number]  Orders Added: 1)  Pneumococcal Vaccine [90732] 2)  Admin 1st Vaccine [16109]

## 2010-05-10 NOTE — Progress Notes (Signed)
Summary: Ucx?  Phone Note Outgoing Call   Call placed by: Margaret Pyle, CMA,  November 01, 2009 3:34 PM Call placed to: Patient Reason for Call: Discuss lab or test results Summary of Call: I contacted pt about Ucx that was ordered at Sat clinic by Dr. Jodene Nam. Lab at [facility] called stating that urine cannot be culture after 24 even if it had been refrigerated. I contacted pt at home number and was advised by pt's son-in-law that he spoke with pt this morning and she felt much better after starting course of Cipro. He will however bring Debra Velasquez in for collection of a new specimen or OV with Dr. Clent Ridges if necessary. Please advise, pt is a resident at [facility] living facility and a specimen could possibly be collected by Nurse that visits with pt. Thanks.  Initial call taken by: Margaret Pyle, CMA,  November 01, 2009 3:39 PM  Follow-up for Phone Call        I do not think a specimen is needed at this point since it would be masked by the Cipro. Finish out the Cipro rx and follow up as needed  Follow-up by: [name] MD,  November 01, 2009 5:20 PM

## 2010-05-10 NOTE — Progress Notes (Signed)
Summary: sample of nasonex  Phone Note Call from Patient Call back at Home Phone [phone] Call back at [phone]   Caller: Daughter-doris Call For: [name] MD Summary of Call: pt needs one sample of nasonex ns  until mailorder arrives please call one into [facility] if no samples are avail Initial call taken by: Heron Sabins,  February 11, 2010 9:34 AM  Follow-up for Phone Call        samples are ready for her  Follow-up by: [name] MD,  February 11, 2010 11:16 AM  Additional Follow-up for Phone Call Additional follow up Details #1::        Phone Call Completed Additional Follow-up by: Kern Reap CMA Duncan Dull),  February 11, 2010 1:59 PM

## 2010-05-10 NOTE — Progress Notes (Signed)
Summary: refill  Phone Note Refill Request Call back at Home Phone [phone] Message from:  daughter---live call  Refills Requested: Medication #1:  METOPROLOL SUCCINATE 50 MG XR24H-TAB once daily send to [facility]  Initial call taken by: Warnell Forester,  October 26, 2009 9:38 AM    Prescriptions: METOPROLOL SUCCINATE 50 MG XR24H-TAB (METOPROLOL SUCCINATE) once daily  #90 x 3   Entered by:   Raechel Ache, RN   Authorized by:   [name] MD   Signed by:   Raechel Ache, RN on 10/26/2009   Method used:   Electronically to        [facility]* (retail)             ,          Ph: [phone]       Fax: [phone]   RxID:   [id number]

## 2010-05-10 NOTE — Progress Notes (Signed)
Summary: 90 day rx mailorder and 14 day supply  Phone Note Call from Patient Call back at Home Phone [phone] Call back at [phone]   Caller: Daughter-doris Call For: [name] MD Summary of Call: pt needs 90 day [facility] mailorder rx fax to [phone] lisinopril/hctz also 14 day supply call [facility] [address] [phone] this [facility]. Initial call taken by: Heron Sabins,  Aug 18, 2009 10:18 AM  Follow-up for Phone Call        Rx Called In Follow-up by: Raechel Ache, RN,  Aug 18, 2009 10:21 AM    Prescriptions: LISINOPRIL-HYDROCHLOROTHIAZIDE 20-12.5 MG TABS (LISINOPRIL-HYDROCHLOROTHIAZIDE) 1 tablet by mouth daily  #14 x 1   Entered by:   Raechel Ache, RN   Authorized by:   [name] MD   Signed by:   Raechel Ache, RN on 08/18/2009   Method used:   Electronically to        [id number] (retail)       [address]       [place], [place]  [address]       Ph: [phone] or [phone]       Fax: [phone]   RxID:   [id number] LISINOPRIL-HYDROCHLOROTHIAZIDE 20-12.5 MG TABS (LISINOPRIL-HYDROCHLOROTHIAZIDE) 1 tablet by mouth daily  #90 x 3   Entered by:   Raechel Ache, RN   Authorized by:   [name] MD   Signed by:   Raechel Ache, RN on 08/18/2009   Method used:   Electronically to        [facility]* (mail-order)             ,          Ph: [phone]       Fax: [phone]   RxID:   [id number]

## 2010-05-10 NOTE — Assessment & Plan Note (Signed)
Summary: eph   Visit Type:  EPH Primary Provider:  [name] MD   History of Present Illness: Debra Velasquez returns today for post hospital visit.  She was admitted with dehydration hyponatremia. She ruled in for myocardial infarction but had a normal EKG.  Echocardiogram showed EF of 60% without any regional wall motion abnormalities. There was mild aortic insufficiency mild regurgitation of the mitral valve moderate left atrial dilatation pulmonary pressure of 30-35. CT of the brain showed atrophy but no stroke.  Debra hyponatremia was corrected. She had an episode of atrial fibrillation in the hospital.  Since discharge and doing well. K. one episode of shortness of breath while lying down. She sat up with no relief. She took 3 nitroglycerin with ultimate relief.    Current Medications (verified): 1)  Fosamax 35 Mg Tabs (Alendronate Sodium) .... Take 1 Tablet By Mouth Once A Week 2)  Aspirin 81 Mg  Tbec (Aspirin) .... Once Daily 3)  Vicodin 5-500 Mg Tabs (Hydrocodone-Acetaminophen) .Marland Kitchen.. 1 Every 6 Hours As Needed Pain 4)  Ventolin Hfa 108 (90 Base) Mcg/act Aers (Albuterol Sulfate) .... 2 Puffs Every 4 Hours As Needed 5)  Nasonex 50 Mcg/act Susp (Mometasone Furoate) .... 2 Sprays Each Nostril Once Daily 6)  Lisinopril 20 Mg Tabs (Lisinopril) .Marland Kitchen.. 1 Tab Once Daily 7)  Metoprolol Succinate 50 Mg Xr24h-Tab (Metoprolol Succinate) .... Once Daily 8)  Vesicare 10 Mg Tabs (Solifenacin Succinate) .... Once Daily 9)  Temazepam 15 Mg Caps (Temazepam) .Marland Kitchen.. 1 Cap At Bedtime 10)  Crestor 10 Mg Tabs (Rosuvastatin Calcium) .Marland Kitchen.. 1 Tab At Bedtime 11)  Omeprazole 40 Mg Cpdr (Omeprazole) .... Once Daily  Allergies (verified): No Known Drug Allergies  Past History:  Past Medical History: Last updated: 11/02/2009 GERD Osteoporosis, last DEXA 08-11-05 Osteoarthritis phlebitis Peptic ulcer disease UTI's heart murmur vertebral compression fractures Hypertension Asthma Urinary  incontinence  Past Surgical History: Last updated: 01/19/2009 shoulder surgery Lumpectomy ureter obstruction surgery colonoscopy 2004, all normal vertebroplasty to T8 per Dr. Venetia Maxon 2010  Family History: Last updated: 12/11/2006 Family History of Stroke F 1st degree relative <60 Family History of Digestive disorder (pancreatic cancer)  Social History: Last updated: 10/30/2009 Retired Single Never Smoked Alcohol use-no Lives in [place] home. ALF  Risk Factors: Smoking Status: never (12/11/2006)  Review of Systems       negative other than history of present illness  Vital Signs:  Patient profile:   77 year old female Height:      65 inches Weight:      118.8 pounds BMI:     19.84 Pulse rate:   65 / minute Pulse rhythm:   regular BP sitting:   100 / 62  (left arm) Cuff size:   large  Vitals Entered By: Danielle Rankin, CMA (December 10, 2009 11:59 AM)  Physical Exam  General:  elderly, in no acute distress Head:  normocephalic and atraumatic Eyes:  PERRLA/EOM intact; conjunctiva and lids normal. Neck:  Neck supple, no JVD. No masses, thyromegaly or abnormal cervical nodes. Chest Wall:  no deformities or breast masses noted Lungs:  Clear bilaterally to auscultation and percussion. Heart:  PMI nondisplaced, regular rate and rhythm, normal S1-S2, carotids equal bilaterally without bruit Msk:  decreased ROM.   Pulses:  pulses normal in all 4 extremities Extremities:  No clubbing or cyanosis. Neurologic:  Alert and oriented x 3. Skin:  Intact without lesions or rashes. Psych:  Normal affect.   EKG  Procedure date:  12/10/2009  Findings:  normal sinus rhythm, normal EKG  Impression & Recommendations:  Problem # 1:  MYOCARDIAL INFARCTION, ACUTE, SUBENDOCARDIAL (ICD-410.70)  Debra episode of dyspnea responding nitroglycerin was most likely some angina or ischemic equivalent. I reviewed that with Debra Velasquez today. We have renewed Debra nitroglycerin and how  to activate 911 if no relief. We will continue medical therapy with no invasive workup. Meds reviewed. Debra updated medication list for this problem includes:    Aspirin 81 Mg Tbec (Aspirin) ..... Once daily    Lisinopril 20 Mg Tabs (Lisinopril) .Marland Kitchen... 1 tab once daily    Metoprolol Succinate 50 Mg Xr24h-tab (Metoprolol succinate) ..... Once daily    Nitrostat 0.4 Mg Subl (Nitroglycerin) .Marland Kitchen... 1 tablet under tongue at onset of chest pain; you may repeat every 5 minutes for up to 3 doses.  Orders: EKG w/ Interpretation (93000)  Problem # 2:  PAROXYSMAL ATRIAL FIBRILLATION (ICD-427.31) Assessment: Improved  Debra updated medication list for this problem includes:    Aspirin 81 Mg Tbec (Aspirin) ..... Once daily    Metoprolol Succinate 50 Mg Xr24h-tab (Metoprolol succinate) ..... Once daily  Problem # 3:  ESSENTIAL HYPERTENSION (ICD-401.9) Assessment: Improved  Debra updated medication list for this problem includes:    Aspirin 81 Mg Tbec (Aspirin) ..... Once daily    Lisinopril 20 Mg Tabs (Lisinopril) .Marland Kitchen... 1 tab once daily    Metoprolol Succinate 50 Mg Xr24h-tab (Metoprolol succinate) ..... Once daily  Orders: EKG w/ Interpretation (93000) TLB-BMP (Basic Metabolic Panel-BMET) (80048-METABOL)  Problem # 4:  DEPENDENT EDEMA, LEGS (ICD-782.3) Assessment: Improved  Patient Instructions: 1)  Your physician recommends that you schedule a follow-up appointment in: 6 months with Dr. Daleen Squibb 2)  Your physician recommends that you have lab work today: BMET 3)  Your physician recommends that you continue on your current medications as directed. Please refer to the Current Medication list given to you today. Prescriptions: NITROSTAT 0.4 MG SUBL (NITROGLYCERIN) 1 tablet under tongue at onset of chest pain; you may repeat every 5 minutes for up to 3 doses.  #25 x 6   Entered by:   Lisabeth Devoid RN   Authorized by:   Gaylord Shih, MD, [facility]   Signed by:   Lisabeth Devoid RN on 12/10/2009   Method used:    Electronically to        [facility]* (retail)             ,          Ph: [phone]       Fax: [phone]   RxID:   [id number] CRESTOR 10 MG TABS (ROSUVASTATIN CALCIUM) 1 tab at bedtime  #90 x 3   Entered by:   Danielle Rankin, CMA   Authorized by:   Gaylord Shih, MD, [facility]   Signed by:   Danielle Rankin, CMA on 12/10/2009   Method used:   Electronically to        [facility]* (retail)             ,          Ph: [phone]       Fax: [phone]   RxID:   [id number]

## 2010-05-12 NOTE — Assessment & Plan Note (Signed)
Summary: eph per dayna/lg   Visit Type:  EPH Primary Provider:  [name] MD  CC:  pt states she has alot of mucus that has a slight yellowish color....sob at times...denies any other complaints today.  History of Present Illness: Debra Velasquez returns for close followup of her coronary disease, history of a non-STEMI, and atrial fibrillation.  She occasionally some chest tightness in the morning. It is associated with some mucus that she has sometimes trouble getting up. She denies any wheezing but does have a history of reflux. She has not had any exertional chest pain.  She denies any nausea vomiting or fever. She  has no daytime cough.  She is on Ventolin and Nasonex.   She denies any palpitations or symptoms of atrial fibrillation. She is in sinus rhythm today  Current Medications (verified): 1)  Aspirin 81 Mg Tbec (Aspirin) .... Take One Tablet By Mouth Daily 2)  Vicodin 5-500 Mg Tabs (Hydrocodone-Acetaminophen) .Marland Kitchen.. 1 Every 6 Hours As Needed Pain 3)  Ventolin Hfa 108 (90 Base) Mcg/act Aers (Albuterol Sulfate) .... 2 Puffs Every 4 Hours As Needed 4)  Nasonex 50 Mcg/act Susp (Mometasone Furoate) .... 2 Sprays Each Nostril Once Daily 5)  Lisinopril 20 Mg Tabs (Lisinopril) .Marland Kitchen.. 1 Tab Once Daily 6)  Metoprolol Succinate 50 Mg Xr24h-Tab (Metoprolol Succinate) .... Once Daily 7)  Vesicare 5 Mg Tabs (Solifenacin Succinate) .Marland Kitchen.. 1 Tab Once Daily 8)  Temazepam 15 Mg Caps (Temazepam) .Marland Kitchen.. 1 Cap At Bedtime 9)  Crestor 10 Mg Tabs (Rosuvastatin Calcium) .Marland Kitchen.. 1 Tab At Bedtime 10)  Nexium 40 Mg Cpdr (Esomeprazole Magnesium) .Marland Kitchen.. 1 Tab Once Daily 11)  Nitrostat 0.4 Mg Subl (Nitroglycerin) .Marland Kitchen.. 1 Tablet Under Tongue At Onset of Chest Pain; You May Repeat Every 5 Minutes For Up To 3 Doses.  Allergies (verified): No Known Drug Allergies  Past History:  Past Medical History: Last updated: 11/02/2009 GERD Osteoporosis, last DEXA 08-11-05 Osteoarthritis phlebitis Peptic ulcer  disease UTI's heart murmur vertebral compression fractures Hypertension Asthma Urinary incontinence  Past Surgical History: Last updated: 01/19/2009 shoulder surgery Lumpectomy ureter obstruction surgery colonoscopy 2004, all normal vertebroplasty to T8 per Dr. Venetia Maxon 2010  Family History: Last updated: 12/11/2006 Family History of Stroke F 1st degree relative <60 Family History of Digestive disorder (pancreatic cancer)  Social History: Last updated: 10/30/2009 Retired Single Never Smoked Alcohol use-no Lives in [place] home. ALF  Risk Factors: Smoking Status: never (12/11/2006)  Review of Systems       negative other than history of present illness  Vital Signs:  Patient profile:   77 year old female Height:      65 inches Weight:      123.50 pounds BMI:     20.63 Pulse rate:   76 / minute Pulse rhythm:   irregular BP sitting:   134 / 78  (left arm) Cuff size:   large  Vitals Entered By: Danielle Rankin, CMA (April 27, 2010 10:21 AM)  Physical Exam  General:  Well developed, well nourished, in no acute distress. Head:  normocephalic and atraumatic Eyes:  PERRLA/EOM intact; conjunctiva and lids normal. Neck:  Neck supple, no JVD. No masses, thyromegaly or abnormal cervical nodes. Lungs:  dry crackles in the bases, no rhonchi wheezes Heart:  MI nondisplaced, regular rate and rhythm, normal S1-S2, no obvious carotid bruit Msk:  decreased ROM.   Pulses:  diminished but present no lower extremity Extremities:  No clubbing or cyanosis. Neurologic:  Alert and oriented x 3. Skin:  Intact without lesions or rashes. Psych:  Normal affect.   Problems:  Medical Problems Added: 1)  Dx of Cad  (ICD-414.00) 2)  Dx of Cough  (ICD-786.2)  Impression & Recommendations:  Problem # 1:  PAROXYSMAL ATRIAL FIBRILLATION (ICD-427.31) Assessment Improved  Her updated medication list for this problem includes:    Aspirin 81 Mg Tbec (Aspirin) .Marland Kitchen... Take one tablet by  mouth daily    Metoprolol Succinate 50 Mg Xr24h-tab (Metoprolol succinate) ..... Once daily  Problem # 2:  ESSENTIAL HYPERTENSION (ICD-401.9) Assessment: Improved  Her updated medication list for this problem includes:    Aspirin 81 Mg Tbec (Aspirin) .Marland Kitchen... Take one tablet by mouth daily    Lisinopril 20 Mg Tabs (Lisinopril) .Marland Kitchen... 1 tab once daily    Metoprolol Succinate 50 Mg Xr24h-tab (Metoprolol succinate) ..... Once daily  Problem # 3:  MYOCARDIAL INFARCTION, ACUTE, SUBENDOCARDIAL (ICD-410.70) Assessment: Unchanged  Her updated medication list for this problem includes:    Aspirin 81 Mg Tbec (Aspirin) .Marland Kitchen... Take one tablet by mouth daily    Lisinopril 20 Mg Tabs (Lisinopril) .Marland Kitchen... 1 tab once daily    Metoprolol Succinate 50 Mg Xr24h-tab (Metoprolol succinate) ..... Once daily    Nitrostat 0.4 Mg Subl (Nitroglycerin) .Marland Kitchen... 1 tablet under tongue at onset of chest pain; you may repeat every 5 minutes for up to 3 doses.  Problem # 4:  COUGH (ICD-786.2) Assessment: New Will prescribe Mucinex 600 mg twice a day. She'll continue to elevate the head of her bed. She will stay on Nexium. If this does not improve in a week or so I've asked her to see the house physician at her home. Her updated medication list for this problem includes:    Aspirin 81 Mg Tbec (Aspirin) .Marland Kitchen... Take one tablet by mouth daily    Lisinopril 20 Mg Tabs (Lisinopril) .Marland Kitchen... 1 tab once daily    Metoprolol Succinate 50 Mg Xr24h-tab (Metoprolol succinate) ..... Once daily    Nitrostat 0.4 Mg Subl (Nitroglycerin) .Marland Kitchen... 1 tablet under tongue at onset of chest pain; you may repeat every 5 minutes for up to 3 doses.  Other Orders: EKG w/ Interpretation (93000)  Patient Instructions: 1)  Your physician recommends that you schedule a follow-up appointment in: 1 year with Dr. Daleen Squibb 2)  Your physician recommends that you continue on your current medications as directed. Please refer to the Current Medication list given to you  today. 3)  Remember to drink plenty of fluids each day. 4)  Continue to elevate the head of your bed. 5)  Follow-up with the [facility] Phys[facility] in a week if you are not better with the Mucinex.

## 2010-05-12 NOTE — Miscellaneous (Signed)
Summary: med update  Clinical Lists Changes  Medications: Changed medication from ASPIRIN 81 MG  TBEC (ASPIRIN) once daily to ASPIRIN 81 MG TBEC (ASPIRIN) Take one tablet by mouth daily Changed medication from OMEPRAZOLE 40 MG CPDR (OMEPRAZOLE) once daily to NEXIUM 40 MG CPDR (ESOMEPRAZOLE MAGNESIUM) 1 tab once daily Changed medication from VESICARE 10 MG TABS (SOLIFENACIN SUCCINATE) once daily to VESICARE 5 MG TABS (SOLIFENACIN SUCCINATE) 1 tab once daily Removed medication of FOSAMAX 35 MG TABS (ALENDRONATE SODIUM) Take 1 tablet by mouth once a week

## 2010-06-20 LAB — BASIC METABOLIC PANEL
CO2: 26 mEq/L (ref 19–32)
CO2: 27 mEq/L (ref 19–32)
Calcium: 9.1 mg/dL (ref 8.4–10.5)
Chloride: 101 mEq/L (ref 96–112)
Chloride: 99 mEq/L (ref 96–112)
Creatinine, Ser: 1.21 mg/dL — ABNORMAL HIGH (ref 0.4–1.2)
GFR calc Af Amer: 53 mL/min — ABNORMAL LOW (ref >60)
Glucose, Bld: 110 mg/dL — ABNORMAL HIGH (ref 70–99)
Potassium: 3.6 mEq/L (ref 3.5–5.1)
Potassium: 3.9 mEq/L (ref 3.5–5.1)
Sodium: 131 mEq/L — ABNORMAL LOW (ref 135–145)
Sodium: 134 mEq/L — ABNORMAL LOW (ref 135–145)

## 2010-06-20 LAB — DIFFERENTIAL
Basophils Absolute: 0 10*3/uL (ref 0.0–0.1)
Basophils Relative: 0 % (ref 0–1)
Monocytes Relative: 9 % (ref 3–12)
Neutro Abs: 4.1 10*3/uL (ref 1.7–7.7)
Neutrophils Relative %: 71 % (ref 43–77)

## 2010-06-20 LAB — CBC
HCT: 33.2 % — ABNORMAL LOW (ref 36.0–46.0)
Hemoglobin: 10.2 g/dL — ABNORMAL LOW (ref 12.0–15.0)
Hemoglobin: 10.9 g/dL — ABNORMAL LOW (ref 12.0–15.0)
MCH: 28.5 pg (ref 26.0–34.0)
MCH: 28.8 pg (ref 26.0–34.0)
MCV: 86.9 fL (ref 78.0–100.0)
RBC: 3.54 MIL/uL — ABNORMAL LOW (ref 3.87–5.11)
RBC: 3.82 MIL/uL — ABNORMAL LOW (ref 3.87–5.11)
WBC: 5.9 10*3/uL (ref 4.0–10.5)
WBC: 6.3 10*3/uL (ref 4.0–10.5)

## 2010-06-20 LAB — CARDIAC PANEL(CRET KIN+CKTOT+MB+TROPI)
CK, MB: 2.2 ng/mL (ref 0.3–4.0)
Troponin I: 0.01 ng/mL (ref 0.00–0.06)

## 2010-06-20 LAB — CK TOTAL AND CKMB (NOT AT ARMC)
CK, MB: 2.7 ng/mL (ref 0.3–4.0)
Relative Index: INVALID (ref 0.0–2.5)
Relative Index: INVALID (ref 0.0–2.5)
Total CK: 84 U/L (ref 7–177)

## 2010-06-20 LAB — TROPONIN I: Troponin I: 0.02 ng/mL (ref 0.00–0.06)

## 2010-06-25 LAB — CARDIAC PANEL(CRET KIN+CKTOT+MB+TROPI)
CK, MB: 32.6 ng/mL (ref 0.3–4.0)
Relative Index: 1.6 (ref 0.0–2.5)
Total CK: 1406 U/L — ABNORMAL HIGH (ref 7–177)
Total CK: 2037 U/L — ABNORMAL HIGH (ref 7–177)
Troponin I: 1.96 ng/mL (ref 0.00–0.06)

## 2010-06-25 LAB — BASIC METABOLIC PANEL
BUN: 11 mg/dL (ref 6–23)
BUN: 12 mg/dL (ref 6–23)
BUN: 17 mg/dL (ref 6–23)
BUN: 7 mg/dL (ref 6–23)
BUN: 7 mg/dL (ref 6–23)
BUN: 9 mg/dL (ref 6–23)
CO2: 21 mEq/L (ref 19–32)
CO2: 23 mEq/L (ref 19–32)
CO2: 24 mEq/L (ref 19–32)
CO2: 24 mEq/L (ref 19–32)
Calcium: 8 mg/dL — ABNORMAL LOW (ref 8.4–10.5)
Calcium: 8.2 mg/dL — ABNORMAL LOW (ref 8.4–10.5)
Calcium: 8.3 mg/dL — ABNORMAL LOW (ref 8.4–10.5)
Chloride: 100 mEq/L (ref 96–112)
Chloride: 82 mEq/L — ABNORMAL LOW (ref 96–112)
Chloride: 98 mEq/L (ref 96–112)
Creatinine, Ser: 0.87 mg/dL (ref 0.4–1.2)
Creatinine, Ser: 0.89 mg/dL (ref 0.4–1.2)
Creatinine, Ser: 0.91 mg/dL (ref 0.4–1.2)
Creatinine, Ser: 0.95 mg/dL (ref 0.4–1.2)
GFR calc Af Amer: >60 mL/min (ref >60)
GFR calc non Af Amer: 55 mL/min — ABNORMAL LOW (ref >60)
GFR calc non Af Amer: >60 mL/min (ref >60)
Glucose, Bld: 101 mg/dL — ABNORMAL HIGH (ref 70–99)
Glucose, Bld: 102 mg/dL — ABNORMAL HIGH (ref 70–99)
Glucose, Bld: 112 mg/dL — ABNORMAL HIGH (ref 70–99)
Glucose, Bld: 119 mg/dL — ABNORMAL HIGH (ref 70–99)
Glucose, Bld: 133 mg/dL — ABNORMAL HIGH (ref 70–99)
Glucose, Bld: 137 mg/dL — ABNORMAL HIGH (ref 70–99)
Potassium: 2.5 mEq/L — CL (ref 3.5–5.1)
Potassium: 4.4 mEq/L (ref 3.5–5.1)
Sodium: 114 mEq/L — CL (ref 135–145)
Sodium: 127 mEq/L — ABNORMAL LOW (ref 135–145)

## 2010-06-25 LAB — CBC
HCT: 33.5 % — ABNORMAL LOW (ref 36.0–46.0)
HCT: 36.2 % (ref 36.0–46.0)
Hemoglobin: 12.2 g/dL (ref 12.0–15.0)
Hemoglobin: 12.2 g/dL (ref 12.0–15.0)
MCH: 31 pg (ref 26.0–34.0)
MCH: 31.8 pg (ref 26.0–34.0)
MCHC: 33.2 g/dL (ref 30.0–36.0)
MCHC: 33.8 g/dL (ref 30.0–36.0)
MCHC: 34.5 g/dL (ref 30.0–36.0)
MCV: 93.4 fL (ref 78.0–100.0)
RDW: 13.4 % (ref 11.5–15.5)
RDW: 13.4 % (ref 11.5–15.5)
RDW: 13.5 % (ref 11.5–15.5)

## 2010-06-25 LAB — URINALYSIS, ROUTINE W REFLEX MICROSCOPIC
Ketones, ur: NEGATIVE mg/dL
Leukocytes, UA: NEGATIVE
Nitrite: NEGATIVE
Specific Gravity, Urine: 1.012 (ref 1.005–1.030)
pH: 5.5 (ref 5.0–8.0)

## 2010-06-25 LAB — TROPONIN I: Troponin I: 2.14 ng/mL (ref 0.00–0.06)

## 2010-06-25 LAB — POCT CARDIAC MARKERS
CKMB, poc: 28.3 ng/mL (ref 1.0–8.0)
Myoglobin, poc: >500 ng/mL (ref 12–200)

## 2010-06-25 LAB — DIFFERENTIAL
Basophils Absolute: 0.1 10*3/uL (ref 0.0–0.1)
Basophils Relative: 0 % (ref 0–1)
Basophils Relative: 1 % (ref 0–1)
Eosinophils Absolute: 0 10*3/uL (ref 0.0–0.7)
Eosinophils Absolute: 0 10*3/uL (ref 0.0–0.7)
Monocytes Absolute: 0.9 10*3/uL (ref 0.1–1.0)
Monocytes Relative: 10 % (ref 3–12)
Neutro Abs: 10.1 10*3/uL — ABNORMAL HIGH (ref 1.7–7.7)
Neutrophils Relative %: 82 % — ABNORMAL HIGH (ref 43–77)
Neutrophils Relative %: 82 % — ABNORMAL HIGH (ref 43–77)

## 2010-06-25 LAB — COMPREHENSIVE METABOLIC PANEL
ALT: 34 U/L (ref 0–35)
Alkaline Phosphatase: 48 U/L (ref 39–117)
BUN: 17 mg/dL (ref 6–23)
CO2: 25 mEq/L (ref 19–32)
Calcium: 8.8 mg/dL (ref 8.4–10.5)
GFR calc non Af Amer: 50 mL/min — ABNORMAL LOW (ref >60)
Glucose, Bld: 109 mg/dL — ABNORMAL HIGH (ref 70–99)
Potassium: 3 mEq/L — ABNORMAL LOW (ref 3.5–5.1)
Total Protein: 6.5 g/dL (ref 6.0–8.3)

## 2010-06-25 LAB — MRSA PCR SCREENING: MRSA by PCR: NEGATIVE

## 2010-06-25 LAB — URINE CULTURE

## 2010-06-25 LAB — URINE MICROSCOPIC-ADD ON

## 2010-07-11 LAB — URINALYSIS, ROUTINE W REFLEX MICROSCOPIC
Bilirubin Urine: NEGATIVE
Hgb urine dipstick: NEGATIVE
Ketones, ur: NEGATIVE mg/dL
Protein, ur: NEGATIVE mg/dL
Urobilinogen, UA: 0.2 mg/dL (ref 0.0–1.0)

## 2010-07-20 LAB — COMPREHENSIVE METABOLIC PANEL
AST: 18 U/L (ref 0–37)
Albumin: 3.5 g/dL (ref 3.5–5.2)
BUN: 23 mg/dL (ref 6–23)
Calcium: 9 mg/dL (ref 8.4–10.5)
Creatinine, Ser: 1.4 mg/dL — ABNORMAL HIGH (ref 0.4–1.2)
GFR calc Af Amer: 42 mL/min — ABNORMAL LOW (ref >60)
GFR calc non Af Amer: 35 mL/min — ABNORMAL LOW (ref >60)

## 2010-07-20 LAB — DIFFERENTIAL
Basophils Absolute: 0 10*3/uL (ref 0.0–0.1)
Eosinophils Relative: 0 % (ref 0–5)
Lymphocytes Relative: 25 % (ref 12–46)
Lymphs Abs: 1.5 10*3/uL (ref 0.7–4.0)
Monocytes Absolute: 0.5 10*3/uL (ref 0.1–1.0)
Neutro Abs: 4 10*3/uL (ref 1.7–7.7)

## 2010-07-20 LAB — CBC
HCT: 28.8 % — ABNORMAL LOW (ref 36.0–46.0)
MCHC: 34.1 g/dL (ref 30.0–36.0)
MCV: 91.9 fL (ref 78.0–100.0)
Platelets: 243 10*3/uL (ref 150–400)

## 2010-07-25 LAB — BASIC METABOLIC PANEL
CO2: 22 mEq/L (ref 19–32)
Chloride: 102 mEq/L (ref 96–112)
Creatinine, Ser: 1.39 mg/dL — ABNORMAL HIGH (ref 0.4–1.2)
GFR calc Af Amer: 43 mL/min — ABNORMAL LOW (ref >60)
Potassium: 4.4 mEq/L (ref 3.5–5.1)

## 2010-07-25 LAB — CBC
HCT: 38.4 % (ref 36.0–46.0)
MCHC: 31.9 g/dL (ref 30.0–36.0)
MCV: 90.2 fL (ref 78.0–100.0)
RBC: 4.26 MIL/uL (ref 3.87–5.11)
WBC: 7.3 10*3/uL (ref 4.0–10.5)

## 2010-08-19 ENCOUNTER — Encounter: Payer: Self-pay | Admitting: Cardiology

## 2010-08-22 ENCOUNTER — Ambulatory Visit (INDEPENDENT_AMBULATORY_CARE_PROVIDER_SITE_OTHER): Payer: MEDICARE | Admitting: Cardiology

## 2010-08-22 ENCOUNTER — Encounter: Payer: Self-pay | Admitting: Cardiology

## 2010-08-22 DIAGNOSIS — I4891 Unspecified atrial fibrillation: Secondary | ICD-10-CM

## 2010-08-22 DIAGNOSIS — I214 Non-ST elevation (NSTEMI) myocardial infarction: Secondary | ICD-10-CM

## 2010-08-22 DIAGNOSIS — I2510 Atherosclerotic heart disease of native coronary artery without angina pectoris: Secondary | ICD-10-CM

## 2010-08-22 DIAGNOSIS — E785 Hyperlipidemia, unspecified: Secondary | ICD-10-CM

## 2010-08-22 MED ORDER — ROSUVASTATIN CALCIUM 5 MG PO TABS: 5.0000 mg | ORAL_TABLET | Freq: Every day | ORAL | Status: DC

## 2010-08-22 NOTE — Patient Instructions (Addendum)
Your physician has recommended you make the following change in your medication: when you have finished your current bottle of Crestor, decrease your medication to 5 mg each evening Your physician recommends that you schedule a follow-up appointment in: 1 year with Dr. Daleen Squibb

## 2010-08-22 NOTE — Assessment & Plan Note (Signed)
Stable, continue medical therapy. 

## 2010-08-22 NOTE — Progress Notes (Signed)
   Patient ID: Debra Velasquez, female    DOB: Sep 30, 1912, 77 y.o.   MRN: [id number]  HPI  Debra Velasquez returns today for E and M of her CAD, history of a NSTEMI, and PAF. She resides at the [facility]. Her daughter is with her today.She denies any ischemic symptoms or palpitations. Her cough has improved.  EKG today shows NSR with PRWP, no acute changes.    Review of Systems  All other systems reviewed and are negative.      Physical Exam  Nursing note and vitals reviewed. Constitutional: She is oriented to person, place, and time. She appears well-developed and well-nourished. No distress.  HENT:  Head: Normocephalic and atraumatic.  Eyes: EOM are normal. Pupils are equal, round, and reactive to light.  Neck: Neck supple. No JVD present. No tracheal deviation present. No thyromegaly present.  Cardiovascular: Normal rate, regular rhythm, normal heart sounds and intact distal pulses.   No murmur heard.      No carotid bruits  Musculoskeletal: She exhibits no edema.       Decreased ROM.  Neurological: She is alert and oriented to person, place, and time.  Skin: Skin is warm and dry.  Psychiatric: She has a normal mood and affect.

## 2010-08-22 NOTE — Assessment & Plan Note (Signed)
Continues to be in NSR. Continue conservative treatment.

## 2010-08-23 NOTE — Op Note (Signed)
[name], [name] NO.:  [id number]   MEDICAL RECORD NO.:  [id number]          PATIENT TYPE:  INP   LOCATION:  3528                         FACILITY:  [facility]   PHYSICIAN:  Danae Orleans. Venetia Maxon, M.D.  DATE OF BIRTH:  1912-08-29   DATE OF PROCEDURE:  04/14/2008  DATE OF DISCHARGE:                               OPERATIVE REPORT   PREOPERATIVE DIAGNOSIS:  T8 compression fracture.   POSTOPERATIVE DIAGNOSIS:  T8 compression fracture.   PROCEDURE:  T8 kyphoplasty.   SURGEON:  Danae Orleans. Venetia Maxon, MD   ANESTHESIA:  General endotracheal anesthesia.   ESTIMATED BLOOD LOSS:  Minimal.   COMPLICATIONS:  None.   DISPOSITION:  Recovery.   INDICATIONS:  Debra Velasquez is a 77 year old lady who fell and fractured  the T8 vertebra.  She has significant intractable pain.  It was elected  to take her to surgery for kyphoplasty of this affected vertebra.   PROCEDURE:  [name] was brought to the operating room.  She was placed  in the prone position on chest and pelvic rolls after satisfactory and  uncomplicated induction of general endotracheal anesthesia.  Using  extrapedicular approach bilaterally, introducers were inserted in the  vertebra of T8 after counting from the sacrum up using biplane  fluoroscopy.  The inflatable bone tamps were inflated.  There was  initial extension of the contrast within the disk space on the right so  consequently the placement was done more from the left.  Filling  pressures were actually fairly high went up to by 400 psi.  It was  elected at this point to perform filling of the vertebra and three  complete fills were performed without evidence of extravasation and with  good filling from side-to-side anterior and posteriorly.  Two were  performed from the left, one from the right.  The introducers were  removed.  A single 3-0 Vicryl stitches were placed and the wounds were  dressed with Dermabond.  The patient was extubated in the operating  room  and taken to the recovery room in stable and satisfactory condition  having tolerated the operation well.  Counts were correct at the end of  the case.      Danae Orleans. Venetia Maxon, M.D.  Electronically Signed     JDS/MEDQ  D:  04/14/2008  T:  04/15/2008  Job:  [id number]

## 2010-08-26 NOTE — Assessment & Plan Note (Signed)
[facility] OFFICE NOTE   REVER, PICHETTE                        MRN:          [id number]  DATE:12/07/2005                            DOB:          1912-10-17    This is a 77 year old woman here for a complete physical examination.  In  general, she is doing well except for one complaint over the past month.  She has had a couple episodes of a sharp pain originating in the left low  back which radiates through the left buttock and down the left leg.  No  numbness or weakness.  No history of trauma.  She has been using Tylenol at  home for it.  I had an introductory visit with her on March 15 of this year.  I refer you to this note concerning details of her past medical history,  family history, social history, habits, etc.   ALLERGIES:  NONE.   CURRENT MEDICATIONS:  1. Fosamax 35 mg per week.  2. Nexium 40 mg per day.  3. Aspirin 81 mg per day.   OBJECTIVE:  Height 5 feet 8 inches, weight 115, BP 138/62, pulse 92 and  regular.  In general, she seems to be doing quite well.  SKIN:  Free of significant lesions.  EYES:  Clear.  Sclerae and pharynx clear.  NECK:  Supple without lymphadenopathy or masses.  LUNGS:  Clear.  CARDIAC:  Rate and rhythm regular without gallops, murmurs or rubs.  Distal  pulses are full.  The EKG is within normal limits.  BREASTS AND AXILLAE:  Clear.  ABDOMEN:  Soft, normal bowel sounds, nontender, no masses.  EXTERNAL GENITALIA:  Mildly atrophic.  RECTAL:  No mass or tenderness.  Stool hemoccult negative.  EXTREMITIES:  No clubbing, cyanosis or edema.  LOWER BACK:  Completely normal today.  NEUROLOGIC:  Exam is grossly intact.   ASSESSMENT AND PLAN:  1. Complete physical.  She is fasting so will check the usual      laboratories.  2. Sciatica.  Naprosyn 500 mg b.i.d. as needed.  3. Osteoporosis, stable.  4. GE reflux disease, stable.    Tera Mater. Clent Ridges, MD   SAF/MedQ  DD:  12/07/2005  DT:  12/08/2005  Job #:  [id number]

## 2010-08-29 ENCOUNTER — Encounter: Payer: Self-pay | Admitting: Cardiology

## 2010-10-16 ENCOUNTER — Emergency Department (HOSPITAL_COMMUNITY)
Admission: EM | Admit: 2010-10-16 | Discharge: 2010-10-16 | Disposition: A | Discharge status: Home / Self Care | Payer: Medicare Other | Attending: Emergency Medicine | Admitting: Emergency Medicine

## 2010-10-16 ENCOUNTER — Emergency Department (HOSPITAL_COMMUNITY): Payer: Medicare Other

## 2010-10-16 DIAGNOSIS — R110 Nausea: Secondary | ICD-10-CM | POA: Insufficient documentation

## 2010-10-16 DIAGNOSIS — Z8711 Personal history of peptic ulcer disease: Secondary | ICD-10-CM | POA: Insufficient documentation

## 2010-10-16 DIAGNOSIS — J3489 Other specified disorders of nose and nasal sinuses: Secondary | ICD-10-CM | POA: Insufficient documentation

## 2010-10-16 DIAGNOSIS — K219 Gastro-esophageal reflux disease without esophagitis: Secondary | ICD-10-CM | POA: Insufficient documentation

## 2010-10-16 DIAGNOSIS — R059 Cough, unspecified: Secondary | ICD-10-CM | POA: Insufficient documentation

## 2010-10-16 DIAGNOSIS — J45909 Unspecified asthma, uncomplicated: Secondary | ICD-10-CM | POA: Insufficient documentation

## 2010-10-16 DIAGNOSIS — R05 Cough: Secondary | ICD-10-CM | POA: Insufficient documentation

## 2010-10-16 DIAGNOSIS — Z79899 Other long term (current) drug therapy: Secondary | ICD-10-CM | POA: Insufficient documentation

## 2010-10-16 DIAGNOSIS — I10 Essential (primary) hypertension: Secondary | ICD-10-CM | POA: Insufficient documentation

## 2010-10-16 DIAGNOSIS — I4891 Unspecified atrial fibrillation: Secondary | ICD-10-CM | POA: Insufficient documentation

## 2010-10-16 DIAGNOSIS — M810 Age-related osteoporosis without current pathological fracture: Secondary | ICD-10-CM | POA: Insufficient documentation

## 2010-10-16 DIAGNOSIS — Z7982 Long term (current) use of aspirin: Secondary | ICD-10-CM | POA: Insufficient documentation

## 2010-10-16 DIAGNOSIS — M1990 Unspecified osteoarthritis, unspecified site: Secondary | ICD-10-CM | POA: Insufficient documentation

## 2010-10-16 LAB — CBC
HCT: 33 % — ABNORMAL LOW (ref 36.0–46.0)
Hemoglobin: 10.9 g/dL — ABNORMAL LOW (ref 12.0–15.0)
RBC: 3.74 MIL/uL — ABNORMAL LOW (ref 3.87–5.11)
WBC: 8.3 10*3/uL (ref 4.0–10.5)

## 2010-10-16 LAB — DIFFERENTIAL
Basophils Relative: 0 % (ref 0–1)
Eosinophils Absolute: 0.2 10*3/uL (ref 0.0–0.7)
Eosinophils Relative: 2 % (ref 0–5)
Lymphs Abs: 1.4 10*3/uL (ref 0.7–4.0)
Monocytes Absolute: 0.8 10*3/uL (ref 0.1–1.0)
Monocytes Relative: 9 % (ref 3–12)
Neutrophils Relative %: 71 % (ref 43–77)

## 2010-10-16 LAB — BASIC METABOLIC PANEL
BUN: 21 mg/dL (ref 6–23)
Chloride: 96 mEq/L (ref 96–112)
Glucose, Bld: 103 mg/dL — ABNORMAL HIGH (ref 70–99)
Potassium: 4.2 mEq/L (ref 3.5–5.1)
Sodium: 133 mEq/L — ABNORMAL LOW (ref 135–145)

## 2010-12-14 ENCOUNTER — Ambulatory Visit
Admission: RE | Admit: 2010-12-14 | Discharge: 2010-12-14 | Disposition: A | Discharge status: Home / Self Care | Payer: Medicare Other | Source: Ambulatory Visit | Attending: Geriatric Medicine | Admitting: Geriatric Medicine

## 2010-12-14 ENCOUNTER — Other Ambulatory Visit: Payer: Self-pay | Admitting: Geriatric Medicine

## 2010-12-14 DIAGNOSIS — R0600 Dyspnea, unspecified: Secondary | ICD-10-CM

## 2010-12-14 DIAGNOSIS — M546 Pain in thoracic spine: Secondary | ICD-10-CM

## 2010-12-14 MED ORDER — IOHEXOL 300 MG/ML  SOLN: 125.0000 mL | Freq: Once | INTRAMUSCULAR | Status: AC | PRN

## 2011-01-09 LAB — URINALYSIS, ROUTINE W REFLEX MICROSCOPIC
Ketones, ur: NEGATIVE
Nitrite: NEGATIVE
Specific Gravity, Urine: 1.011
Urobilinogen, UA: 0.2
pH: 7.5

## 2011-01-09 LAB — CBC
Hemoglobin: 11.4 — ABNORMAL LOW
MCHC: 33.1
RBC: 3.85 — ABNORMAL LOW
RDW: 13.7

## 2011-01-09 LAB — COMPREHENSIVE METABOLIC PANEL
ALT: 10
AST: 18
Alkaline Phosphatase: 58
Calcium: 9.1
GFR calc Af Amer: 49 — ABNORMAL LOW
Glucose, Bld: 105 — ABNORMAL HIGH
Potassium: 4.3
Sodium: 132 — ABNORMAL LOW
Total Protein: 6.5

## 2011-01-09 LAB — DIFFERENTIAL
Basophils Relative: 1
Eosinophils Absolute: 0
Eosinophils Relative: 0
Lymphs Abs: 0.9
Monocytes Absolute: 0.4
Monocytes Relative: 10
Neutrophils Relative %: 71

## 2011-01-09 LAB — GLUCOSE, CAPILLARY

## 2011-01-09 LAB — POCT CARDIAC MARKERS
CKMB, poc: 1.6
Troponin i, poc: <0.05

## 2011-01-09 LAB — B-NATRIURETIC PEPTIDE (CONVERTED LAB): Pro B Natriuretic peptide (BNP): 102 — ABNORMAL HIGH

## 2011-03-05 ENCOUNTER — Other Ambulatory Visit: Payer: Self-pay | Admitting: Family Medicine

## 2011-08-11 ENCOUNTER — Encounter: Payer: Self-pay | Admitting: *Deleted

## 2011-08-23 ENCOUNTER — Encounter: Payer: Self-pay | Admitting: Cardiology

## 2011-08-23 ENCOUNTER — Encounter (INDEPENDENT_AMBULATORY_CARE_PROVIDER_SITE_OTHER): Payer: Medicare Other | Admitting: Cardiology

## 2011-08-23 VITALS — BP 132/66 | HR 67 | Ht 68.0 in | Wt 122.0 lb

## 2011-08-23 DIAGNOSIS — I2510 Atherosclerotic heart disease of native coronary artery without angina pectoris: Secondary | ICD-10-CM

## 2011-08-23 DIAGNOSIS — I4891 Unspecified atrial fibrillation: Secondary | ICD-10-CM

## 2011-08-23 NOTE — Assessment & Plan Note (Signed)
Doing remarkably well. No change in current medical therapy. Her Crestor was discontinued because of some leg aches and I do not see a reason to restart it. Very little value at this point considering her age. She and her family are comfortable with this.

## 2011-08-23 NOTE — Progress Notes (Signed)
This encounter was created in error - please disregard.

## 2011-08-23 NOTE — Assessment & Plan Note (Signed)
No clinical recurrence. Continue conservative management.

## 2011-08-23 NOTE — Patient Instructions (Signed)
Your physician recommends that you continue on your current medications as directed. Please refer to the Current Medication list given to you today.  Your physician wants you to follow-up in: 1 year. You will receive a reminder letter in the mail two months in advance. If you don't receive a letter, please call our office to schedule the follow-up appointment.  

## 2011-08-29 ENCOUNTER — Emergency Department (HOSPITAL_COMMUNITY): Payer: Medicare Other

## 2011-08-29 ENCOUNTER — Encounter (HOSPITAL_COMMUNITY): Payer: Self-pay | Admitting: *Deleted

## 2011-08-29 ENCOUNTER — Observation Stay (HOSPITAL_COMMUNITY)
Admission: EM | Admit: 2011-08-29 | Discharge: 2011-08-31 | Disposition: A | Discharge status: Skilled Nursing Facility | Payer: Medicare Other | Attending: Orthopedic Surgery | Admitting: Orthopedic Surgery

## 2011-08-29 DIAGNOSIS — W19XXXA Unspecified fall, initial encounter: Secondary | ICD-10-CM | POA: Insufficient documentation

## 2011-08-29 DIAGNOSIS — I2510 Atherosclerotic heart disease of native coronary artery without angina pectoris: Secondary | ICD-10-CM | POA: Insufficient documentation

## 2011-08-29 DIAGNOSIS — K219 Gastro-esophageal reflux disease without esophagitis: Secondary | ICD-10-CM | POA: Insufficient documentation

## 2011-08-29 DIAGNOSIS — J45909 Unspecified asthma, uncomplicated: Secondary | ICD-10-CM | POA: Insufficient documentation

## 2011-08-29 DIAGNOSIS — S52609A Unspecified fracture of lower end of unspecified ulna, initial encounter for closed fracture: Secondary | ICD-10-CM | POA: Insufficient documentation

## 2011-08-29 DIAGNOSIS — M810 Age-related osteoporosis without current pathological fracture: Secondary | ICD-10-CM | POA: Insufficient documentation

## 2011-08-29 DIAGNOSIS — I10 Essential (primary) hypertension: Secondary | ICD-10-CM | POA: Insufficient documentation

## 2011-08-29 DIAGNOSIS — Z7982 Long term (current) use of aspirin: Secondary | ICD-10-CM | POA: Insufficient documentation

## 2011-08-29 DIAGNOSIS — Z79899 Other long term (current) drug therapy: Secondary | ICD-10-CM | POA: Insufficient documentation

## 2011-08-29 DIAGNOSIS — S52509A Unspecified fracture of the lower end of unspecified radius, initial encounter for closed fracture: Principal | ICD-10-CM | POA: Insufficient documentation

## 2011-08-29 DIAGNOSIS — I252 Old myocardial infarction: Secondary | ICD-10-CM | POA: Insufficient documentation

## 2011-08-29 MED ORDER — ALBUTEROL SULFATE HFA 108 (90 BASE) MCG/ACT IN AERS: 2.0000 | INHALATION_SPRAY | RESPIRATORY_TRACT | Status: DC | PRN

## 2011-08-29 MED ORDER — ONDANSETRON HCL 4 MG PO TABS: 4.0000 mg | ORAL_TABLET | Freq: Four times a day (QID) | ORAL | Status: DC | PRN

## 2011-08-29 MED ORDER — FESOTERODINE FUMARATE ER 8 MG PO TB24
8.0000 mg | ORAL_TABLET | Freq: Every day | ORAL | Status: DC
  Administered 2011-08-30 – 2011-08-31 (×2): 8 mg via ORAL
  Filled 2011-08-29 (×3): qty 1

## 2011-08-29 MED ORDER — MORPHINE SULFATE 2 MG/ML IJ SOLN
2.0000 mg | Freq: Once | INTRAMUSCULAR | Status: AC
  Administered 2011-08-29: 2 mg via INTRAVENOUS
  Filled 2011-08-29: qty 1

## 2011-08-29 MED ORDER — LIDOCAINE HCL 2 % IJ SOLN
INTRAMUSCULAR | Status: AC
  Administered 2011-08-29: 19:00:00 via INTRAMUSCULAR
  Filled 2011-08-29: qty 2

## 2011-08-29 MED ORDER — ASPIRIN 81 MG PO TABS: 81.0000 mg | ORAL_TABLET | Freq: Every day | ORAL | Status: DC

## 2011-08-29 MED ORDER — ONDANSETRON HCL 4 MG/2ML IJ SOLN: 4.0000 mg | Freq: Four times a day (QID) | INTRAMUSCULAR | Status: DC | PRN

## 2011-08-29 MED ORDER — OXYCODONE-ACETAMINOPHEN 5-325 MG PO TABS
1.0000 | ORAL_TABLET | ORAL | Status: DC | PRN
  Administered 2011-08-30: 1 via ORAL
  Filled 2011-08-29: qty 1

## 2011-08-29 MED ORDER — PANTOPRAZOLE SODIUM 40 MG PO TBEC
80.0000 mg | DELAYED_RELEASE_TABLET | Freq: Every day | ORAL | Status: DC
  Administered 2011-08-29 – 2011-08-31 (×3): 80 mg via ORAL
  Filled 2011-08-29 (×3): qty 2

## 2011-08-29 MED ORDER — GUAIFENESIN ER 600 MG PO TB12
600.0000 mg | ORAL_TABLET | ORAL | Status: DC | PRN
Start: 2011-08-29 — End: 2011-08-31
  Filled 2011-08-29: qty 1

## 2011-08-29 MED ORDER — SODIUM CHLORIDE 0.45 % IV SOLN
INTRAVENOUS | Status: DC
  Administered 2011-08-29: 21:00:00 via INTRAVENOUS
  Filled 2011-08-29: qty 1000

## 2011-08-29 MED ORDER — LIDOCAINE HCL 1 % IJ SOLN
INTRAMUSCULAR | Status: AC
  Filled 2011-08-29: qty 40

## 2011-08-29 MED ORDER — LISINOPRIL 20 MG PO TABS
20.0000 mg | ORAL_TABLET | Freq: Every day | ORAL | Status: DC
  Administered 2011-08-30 – 2011-08-31 (×2): 20 mg via ORAL
  Filled 2011-08-29 (×2): qty 1

## 2011-08-29 MED ORDER — BACITRACIN ZINC 500 UNIT/GM EX OINT
TOPICAL_OINTMENT | CUTANEOUS | Status: AC
  Administered 2011-08-29: 6 via SUBCUTANEOUS
  Filled 2011-08-29: qty 5.4

## 2011-08-29 MED ORDER — MORPHINE SULFATE 2 MG/ML IJ SOLN
1.0000 mg | INTRAMUSCULAR | Status: DC | PRN
Start: 2011-08-29 — End: 2011-08-30
  Administered 2011-08-29: 1 mg via INTRAVENOUS
  Filled 2011-08-29: qty 1

## 2011-08-29 MED ORDER — METOPROLOL SUCCINATE ER 50 MG PO TB24
50.0000 mg | ORAL_TABLET | Freq: Every day | ORAL | Status: DC
  Administered 2011-08-30 – 2011-08-31 (×2): 50 mg via ORAL
  Filled 2011-08-29 (×2): qty 1

## 2011-08-29 MED ORDER — FLUTICASONE PROPIONATE 50 MCG/ACT NA SUSP
2.0000 | Freq: Every day | NASAL | Status: DC
  Administered 2011-08-30: 2 via NASAL
  Filled 2011-08-29: qty 16

## 2011-08-29 MED ORDER — FLUTICASONE FUROATE 27.5 MCG/SPRAY NA SUSP: 2.0000 | Freq: Every day | NASAL | Status: DC

## 2011-08-29 MED ORDER — ONDANSETRON HCL 4 MG/2ML IJ SOLN
4.0000 mg | Freq: Once | INTRAMUSCULAR | Status: AC
  Administered 2011-08-29: 4 mg via INTRAVENOUS
  Filled 2011-08-29: qty 2

## 2011-08-29 MED ORDER — ASPIRIN 81 MG PO CHEW
81.0000 mg | CHEWABLE_TABLET | Freq: Every day | ORAL | Status: DC
  Administered 2011-08-30 – 2011-08-31 (×2): 81 mg via ORAL
  Filled 2011-08-29 (×3): qty 1

## 2011-08-29 NOTE — ED Notes (Signed)
ZOX:[id number]<AV> Expected date:08/29/11<BR> Expected time:<BR> Means of arrival:<BR> Comments:<BR> EMS 20 GC - fall/arm fx

## 2011-08-29 NOTE — ED Notes (Signed)
2nd Xray ordered for post reduction films

## 2011-08-29 NOTE — ED Provider Notes (Signed)
History     CSN: [id number]  Arrival date & time 08/29/11  1342   First MD Initiated Contact with Patient 08/29/11 1413      Chief Complaint  Patient presents with  . Fall  . Wrist Pain    (Consider location/radiation/quality/duration/timing/severity/associated sxs/prior treatment) Patient is a 78 y.o. female presenting with fall and wrist pain. The history is provided by the patient.  Fall Pertinent negatives include no fever, no numbness, no abdominal pain, no vomiting and no headaches.  Wrist Pain Pertinent negatives include no chest pain, no abdominal pain, no headaches and no shortness of breath.  pt was going to hair salon, tripped fell forward landing on outstretched left wrist. Pain, deformity to wrist. Skin intact. Constant, dull, non radiating pain, worse w palpation. No associated numbness. No elbow or shoulder pain. Denies head injury or loc. No headache. No faintness or dizziness. No neck or back pain. Right hand dominant. Has no orthopedist.   Past Medical History  Diagnosis Date  . Acute bronchitis   . Acute sinusitis, unspecified   . Allergic rhinitis, cause unspecified   . Altered mental status   . Unspecified asthma   . Pain in thoracic spine   . Coronary atherosclerosis of unspecified type of vessel, native or graft   . Cellulitis and abscess of leg, except foot   . Closed fracture of dorsal (thoracic) vertebra without mention of spinal cord injury   . Unspecified constipation   . Cough   . Edema   . Unspecified essential hypertension   . Esophageal reflux   . Insomnia, unspecified   . Acute myocardial infarction, subendocardial infarction, episode of care unspecified   . Osteoarthrosis, unspecified whether generalized or localized, unspecified site   . Osteoporosis, unspecified     DEXA 2007  . Atrial fibrillation   . Peptic ulcer, unspecified site, unspecified as acute or chronic, without mention of hemorrhage, perforation, or obstruction   .  Shortness of breath   . Unspecified urinary incontinence   . Urinary tract infection, site not specified   . Dizziness and giddiness   . Heart murmur   . Asthma     Past Surgical History  Procedure Date  . Shoulder surgery   . Breast lumpectomy   . Ureter obstruction surgery   . Colonoscopy 2004    normal  . Vertebroplasty 2010    vertebroplasty to T8 per Dr. Venetia Maxon    Family History  Problem Relation Age of Onset  . Stroke Other     F 1st degree <60  . Pancreatic cancer Other   . Other      digestive disorder    History  Substance Use Topics  . Smoking status: Never Smoker   . Smokeless tobacco: Never Used  . Alcohol Use: No    OB History    Grav Para Term Preterm Abortions TAB SAB Ect Mult Living                  Review of Systems  Constitutional: Negative for fever.  HENT: Negative for neck pain.   Eyes: Negative for pain.  Respiratory: Negative for shortness of breath.   Cardiovascular: Negative for chest pain.  Gastrointestinal: Negative for vomiting and abdominal pain.  Genitourinary: Negative for flank pain.  Musculoskeletal: Negative for back pain.  Neurological: Negative for syncope, weakness, numbness and headaches.  Hematological: Does not bruise/bleed easily.  Psychiatric/Behavioral: Negative for confusion.    Allergies  Review of patient's allergies indicates no  known allergies.  Home Medications   Current Outpatient Rx  Name Route Sig Dispense Refill  . ALBUTEROL SULFATE HFA 108 (90 BASE) MCG/ACT IN AERS Inhalation Inhale 2 puffs into the lungs every 4 (four) hours as needed.      . ASPIRIN 81 MG PO TABS Oral Take 81 mg by mouth daily.      . CO Q 10 100 MG PO CAPS Oral Take by mouth daily.    . B-12 1000 MCG PO CAPS Oral Take by mouth daily.    Marland Kitchen FLUTICASONE FUROATE 27.5 MCG/SPRAY NA SUSP Nasal Place 2 sprays into the nose daily.    Marland Kitchen LISINOPRIL 20 MG PO TABS Oral Take 20 mg by mouth daily.      Marland Kitchen METOPROLOL SUCCINATE ER 50 MG PO TB24  Oral Take 50 mg by mouth daily.      Marland Kitchen OMEPRAZOLE 40 MG PO CPDR  TAKE 1 CAPSULE DAILY 90 capsule 1  . TOLTERODINE TARTRATE ER 4 MG PO CP24 Oral Take 4 mg by mouth daily.    . GUAIFENESIN ER 600 MG PO TB12 Oral Take 600 mg by mouth as needed.     Marland Kitchen HYDROCODONE-ACETAMINOPHEN 5-500 MG PO TABS Oral Take 1 tablet by mouth every 6 (six) hours as needed.      Marland Kitchen NITROGLYCERIN 0.4 MG SL SUBL Sublingual Place 0.4 mg under the tongue every 5 (five) minutes as needed. 1 tab under tongue at onset of chest pain; you may repeat every 5 minutes for up to 3 doses       BP 159/62  Pulse 88  Temp(Src) 98.1 F (36.7 C) (Oral)  Resp 18  SpO2 95%  Physical Exam  Nursing note and vitals reviewed. Constitutional: She is oriented to person, place, and time. She appears well-developed and well-nourished. No distress.  HENT:  Head: Atraumatic.  Eyes: Conjunctivae are normal. Pupils are equal, round, and reactive to light. No scleral icterus.  Neck: Normal range of motion. Neck supple. No tracheal deviation present.  Cardiovascular: Normal rate, normal heart sounds and intact distal pulses.   Pulmonary/Chest: Effort normal and breath sounds normal. No respiratory distress. She exhibits no tenderness.  Abdominal: Soft. Normal appearance. She exhibits no distension. There is no tenderness.  Musculoskeletal: She exhibits no edema.       ctls spine non tender, aligned, no step off.  Deformity left wrist, radial pulse 2+, skin intact. No elbow or shoulder tenderness/pain good rom.  Neurological: She is alert and oriented to person, place, and time.       Motor intact bil. L hand nvi.   Skin: Skin is warm and dry. No rash noted.  Psychiatric: She has a normal mood and affect.    ED Course  Procedures (including critical care time)  Results for orders placed during the hospital encounter of 10/16/10  DIFFERENTIAL      Component Value Range   Neutrophils Relative 71  43 - 77 (%)   Neutro Abs 5.9  1.7 - 7.7  (K/uL)   Lymphocytes Relative 17  12 - 46 (%)   Lymphs Abs 1.4  0.7 - 4.0 (K/uL)   Monocytes Relative 9  3 - 12 (%)   Monocytes Absolute 0.8  0.1 - 1.0 (K/uL)   Eosinophils Relative 2  0 - 5 (%)   Eosinophils Absolute 0.2  0.0 - 0.7 (K/uL)   Basophils Relative 0  0 - 1 (%)   Basophils Absolute 0.0  0.0 - 0.1 (K/uL)  CBC      Component Value Range   WBC 8.3  4.0 - 10.5 (K/uL)   RBC 3.74 (*) 3.87 - 5.11 (MIL/uL)   Hemoglobin 10.9 (*) 12.0 - 15.0 (g/dL)   HCT 16.1 (*) 09.6 - 46.0 (%)   MCV 88.2  78.0 - 100.0 (fL)   MCH 29.1  26.0 - 34.0 (pg)   MCHC 33.0  30.0 - 36.0 (g/dL)   RDW 04.5  40.9 - 81.1 (%)   Platelets 326  150 - 400 (K/uL)  BASIC METABOLIC PANEL      Component Value Range   Sodium 133 (*) 135 - 145 (mEq/L)   Potassium 4.2  3.5 - 5.1 (mEq/L)   Chloride 96  96 - 112 (mEq/L)   CO2 29  19 - 32 (mEq/L)   Glucose, Bld 103 (*) 70 - 99 (mg/dL)   BUN 21  6 - 23 (mg/dL)   Creatinine, Ser 9.14  0.50 - 1.10 (mg/dL)   Calcium 8.9  8.4 - 78.2 (mg/dL)   GFR calc non Af Amer 47 (*) >60 (mL/min)   GFR calc Af Amer 56 (*) >60 (mL/min)   Dg Wrist Complete Left  08/29/2011  *RADIOLOGY REPORT*  Clinical Data: Wrist pain, swelling and bruising status post fall.  LEFT WRIST - COMPLETE 3+ VIEW  Comparison: None.  Findings: There is generalized osteopenia.  There is a comminuted, impacted and posteriorly displaced fracture of the distal radius. There is possible intra-articular extension of this fracture. There is an oblique fracture of the distal ulnar metaphysis which is mildly displaced in a dorsal and radial direction.  No carpal bone fracture or dislocation is seen.  There are degenerative changes at the base of the first metacarpal.  There is diffuse soft tissue swelling in the distal forearm.  IMPRESSION: Comminuted and displaced fractures of the distal radius and ulna as described.  Per CMS PQRS reporting requirements (PQRS Measure 24): Given the patient's age of greater than 50 and the  fracture site (hip, distal radius, or spine), the patient should be tested for osteoporosis using DXA, and the appropriate treatment considered based on the DXA results.  Original Report Authenticated By: Gerrianne Scale, M.D.        MDM  Iv ns. Morphine iv. zofran iv. Xray, splint, ice.   Hand surgery paged.   Discussed w Dr Amanda Pea - they will see pt and reduce in ed.  Recheck pt comfortable.    Recheck pain returns. Morphine iv.   Suzi Roots, MD 08/29/11 1525

## 2011-08-29 NOTE — ED Notes (Signed)
Attempted to call report nurse unable to take reports at this time. Was told that nurse would call me back.

## 2011-08-29 NOTE — Consult Note (Signed)
Reason for Consult:Fracturede Left distal radius and Ulna Referring Physician: Benjiman Core M.D.  Debra Velasquez is an 78 y.o. female.  HPI: Debra Velasquez is a delightful 78 yo female, RHD, who presents to the [facility] for evaluation of her of her left wrist. The patient is a resident of [facility] and unfortunately tripped on her way to have her hair fixed. She sustained a FOOSH to the left upper extremity with notable pain, swelling and disability about the hand. She was seen and evaluated by the E.R. Staff and was noted to have a displaced left distal radius and ulna fracture. She denies any LOC,chest pain, SOB, Numbness or tingling of the upper extremity. Her daughter accompanies her today. She denies other injury.  Past Medical History  Diagnosis Date  . Acute bronchitis   . Acute sinusitis, unspecified   . Allergic rhinitis, cause unspecified   . Altered mental status   . Unspecified asthma   . Pain in thoracic spine   . Coronary atherosclerosis of unspecified type of vessel, native or graft   . Cellulitis and abscess of leg, except foot   . Closed fracture of dorsal (thoracic) vertebra without mention of spinal cord injury   . Unspecified constipation   . Cough   . Edema   . Unspecified essential hypertension   . Esophageal reflux   . Insomnia, unspecified   . Acute myocardial infarction, subendocardial infarction, episode of care unspecified   . Osteoarthrosis, unspecified whether generalized or localized, unspecified site   . Osteoporosis, unspecified     DEXA 2007  . Atrial fibrillation   . Peptic ulcer, unspecified site, unspecified as acute or chronic, without mention of hemorrhage, perforation, or obstruction   . Shortness of breath   . Unspecified urinary incontinence   . Urinary tract infection, site not specified   . Dizziness and giddiness   . Heart murmur   . Asthma     Past Surgical History  Procedure Date  . Shoulder surgery   . Breast lumpectomy     . Ureter obstruction surgery   . Colonoscopy 2004    normal  . Vertebroplasty 2010    vertebroplasty to T8 per Dr. Venetia Maxon    Family History  Problem Relation Age of Onset  . Stroke Other     F 1st degree <60  . Pancreatic cancer Other   . Other      digestive disorder    Social History:  reports that she has never smoked. She has never used smokeless tobacco. She reports that she does not drink alcohol. Her drug history not on file.  Allergies: No Known Allergies  Medications:  ALBUTEROL SULFATE HFA 108 (90 BASE) MCG/ACT IN AERS  Inhalation  Inhale 2 puffs into the lungs every 4 (four) hours as needed.     .  ASPIRIN 81 MG PO TABS  Oral  Take 81 mg by mouth daily.     .  CO Q 10 100 MG PO CAPS  Oral  Take by mouth daily.     .  B-12 1000 MCG PO CAPS  Oral  Take by mouth daily.     Marland Kitchen  FLUTICASONE FUROATE 27.5 MCG/SPRAY NA SUSP  Nasal  Place 2 sprays into the nose daily.     Marland Kitchen  LISINOPRIL 20 MG PO TABS  Oral  Take 20 mg by mouth daily.     Marland Kitchen  METOPROLOL SUCCINATE ER 50 MG PO TB24  Oral  Take 50 mg  by mouth daily.     Marland Kitchen  OMEPRAZOLE 40 MG PO CPDR   TAKE 1 CAPSULE DAILY  90 capsule  1   .  TOLTERODINE TARTRATE ER 4 MG PO CP24  Oral  Take 4 mg by mouth daily.     .  GUAIFENESIN ER 600 MG PO TB12  Oral  Take 600 mg by mouth as needed.     Marland Kitchen  HYDROCODONE-ACETAMINOPHEN 5-500 MG PO TABS  Oral  Take 1 tablet by mouth every 6 (six) hours as needed.     Marland Kitchen  NITROGLYCERIN 0.4 MG SL SUBL  Sublingual  Place 0.4 mg under the tongue every 5 (five) minutes as needed. 1 tab under tongue at onset of chest pain; you may repeat every 5 minutes for up to 3 doses         No results found for this or any previous visit (from the past 48 hour(s)).  Dg Wrist Complete Left  08/29/2011  *RADIOLOGY REPORT*  Clinical Data: Wrist pain, swelling and bruising status post fall.  LEFT WRIST - COMPLETE 3+ VIEW  Comparison: None.  Findings: There is generalized osteopenia.  There is a comminuted, impacted and  posteriorly displaced fracture of the distal radius. There is possible intra-articular extension of this fracture. There is an oblique fracture of the distal ulnar metaphysis which is mildly displaced in a dorsal and radial direction.  No carpal bone fracture or dislocation is seen.  There are degenerative changes at the base of the first metacarpal.  There is diffuse soft tissue swelling in the distal forearm.  IMPRESSION: Comminuted and displaced fractures of the distal radius and ulna as described.  Per CMS PQRS reporting requirements (PQRS Measure 24): Given the patient's age of greater than 50 and the fracture site (hip, distal radius, or spine), the patient should be tested for osteoporosis using DXA, and the appropriate treatment considered based on the DXA results.  Original Report Authenticated By: Gerrianne Scale, M.D.    Review of Systems  Constitutional: Negative.   HENT: Negative.   Eyes: Negative.   Respiratory: Negative.   Cardiovascular: Negative.   Gastrointestinal: Negative.   Musculoskeletal: Positive for back pain.       See HPI, Patient has HX of Chronic low back pain  Skin: Negative.   Neurological: Negative.   Endo/Heme/Allergies: Negative.    Blood pressure 190/68, pulse 76, temperature 98.1 F (36.7 C), temperature source Oral, resp. rate 14, SpO2 97.00%. Physical Exam Heent: atraumatic, normocephalic Chest: equal bilat expansions noted, no audible wheezing or rhonchi noted Abdomen: nt Lue: mild swelling of the wrist, scant ecchymosis, skin is intact with no abrasions or tears, median, ulnar, radial nerve intact. Digital rom intact, no signs of compartment syndrome. Positive deformity  Assessment/Plan: Left distal radius and Ulna fracture displaced and angulated Unspecified Essential Hypertension  Old myocardial infarction  PAROXYSMAL ATRIAL FIBRILLATION  ACUTE SINUSITIS, UNSPECIFIED  ACUTE BRONCHITIS  ALLERGIC RHINITIS  ASTHMA  GERD  PEPTIC ULCER DISEASE    CONSTIPATION  UTI  CELLULITIS, LEGS  OSTEOARTHRITIS  BACK PAIN, THORACIC REGION  OSTEOPOROSIS  VERTIGO  INSOMNIA  ALTERED MENTAL STATUS  DEPENDENT EDEMA, LEGS  SHORTNESS OF BREATH  URINARY INCONTINENCE  COMPRESSION FRACTURE, THORACIC VERTEBRA  CAD  COUGH   We have had a lengthy discussion with patient in regards to her upper extremity and treatment options, certainly given the nature of her fracture, surgical intervention may be unavoidable. However, we have discussed with she and her daughter attempting  a closed reduction, close observation, and if need be surgery on an elective basis. The patient and daughter state good understanding to her predicament and plan, and would like to try and avoid surgery at all costs. After obtaining verbal consent, the patient underwent hematoma block x 2 to the radius and ulna utilizing 10cc of 2% xylocaine without epinephrine. She tolerated this well, general skin care was applied, and closed reduction of the left distal radius and ulna was implemented. The patient was placed in finger trap traction with 10 lbs of weight. A long arm splint with sugar tong modification was placed on the extremity. 3 point mold was applied. The patient remained neurovascularly intact throughout the procedure and postreduction. Postreduction films showed improved alignment, however, we once again discussed our concerns in regards to progressive angulation and collapse into the future. The patient will be discharged to [facility] and follow up with [place] this Friday at 1:00 pm. We discussed diligent elevation, nonweightbearing to the left hand, and digital ROM. All questions were encouraged and answered.  Carvell Hoeffner L 08/29/2011, 6:48 PM

## 2011-08-29 NOTE — ED Notes (Addendum)
Per EMS pt was on way to get hair done when she fell and landed on left wrist. EMS applied air splint. EMS reports +distal pulses. Pt was given of Fentanyl en route.

## 2011-08-29 NOTE — ED Notes (Signed)
250 mcg of Fentanyl given to patient per EMS

## 2011-08-30 MED ORDER — HYDROCODONE-ACETAMINOPHEN 5-325 MG PO TABS
1.0000 | ORAL_TABLET | ORAL | Status: DC | PRN
  Administered 2011-08-30 – 2011-08-31 (×3): 1 via ORAL
  Filled 2011-08-30 (×5): qty 1

## 2011-08-30 NOTE — Evaluation (Signed)
Occupational Therapy Evaluation Patient Details Name: Debra Velasquez MRN: [id number] DOB: 12/30/1912 Today's Date: 08/30/2011 Time: 0920-1000 OT Time Calculation (min): 40 min  OT Assessment / Plan / Recommendation Clinical Impression  Pt presents with distal radius and ulna fracture (FOOSH) following a fall when leaving independent living facility.  Tolerated some ambulation with L platform RW, however pt is very confused and agitated, which limited evaluation and treatment.  Daughter present for end of session, stating that she is normally not confused.  Pt will benefit from skilled OT services to improver her independence and safety with ADL for next venue of care.     OT Assessment  Patient needs continued OT Services    Follow Up Recommendations  Skilled nursing facility    Barriers to Discharge      Equipment Recommendations  Defer to next venue    Recommendations for Other Services    Frequency  Min 2X/week    Precautions / Restrictions Precautions Precautions: Fall Required Braces or Orthoses: Other Brace/Splint Other Brace/Splint: L UE sling for mobilizing OOB versus platform RW. Elevate L UE on pillows Restrictions Weight Bearing Restrictions: Yes LUE Weight Bearing: Non weight bearing Other Position/Activity Restrictions: platform RW ok to use per Dr Amanda Pea.        ADL  Eating/Feeding: Simulated;Set up Where Assessed - Eating/Feeding: Bed level Grooming: Simulated;Wash/dry face;Minimal assistance Where Assessed - Grooming: Unsupported sitting Upper Body Bathing: Simulated;Chest;Right arm;Left arm;Abdomen;Moderate assistance Where Assessed - Upper Body Bathing: Unsupported sitting Lower Body Bathing: Simulated;+2 Total assistance Lower Body Bathing: Patient Percentage: 50% Where Assessed - Lower Body Bathing: Supported sit to stand Upper Body Dressing: Simulated;Maximal assistance;Other (comment) (to doff shirt that was on R UE only. and don a gown) Where  Assessed - Upper Body Dressing: Unsupported sitting Lower Body Dressing: +2 Total assistance Lower Body Dressing: Patient Percentage: 40% Where Assessed - Lower Body Dressing: Sopported sit to stand Toilet Transfer: Performed;+2 Total assistance;Other (comment) (PFRW and assist to steer RW, verbal cues) Toilet Transfer: Patient Percentage: 70% Toilet Transfer Method: Other (comment) (ambulating) Toilet Transfer Equipment: Raised toilet seat with arms (or 3-in-1 over toilet) Toileting - Clothing Manipulation and Hygiene: Simulated;+2 Total assistance Toileting - Clothing Manipulation and Hygiene: Patient Percentage: 50% Where Assessed - Toileting Clothing Manipulation and Hygiene: Standing Tub/Shower Transfer Method: Not assessed Equipment Used: Other (comment) (PFRW) ADL Comments: pt requiring much encouragement to participate throughout session and pt confused and alittle agitated. Pt's daughter came at end of session and states this is very different from patient's baseline    OT Diagnosis: Generalized weakness;Cognitive deficits  OT Problem List: Decreased strength;Decreased range of motion;Decreased activity tolerance;Decreased cognition;Decreased knowledge of precautions;Impaired UE functional use;Decreased knowledge of use of DME or AE;Decreased safety awareness OT Treatment Interventions: Self-care/ADL training;Therapeutic activities;DME and/or AE instruction;Patient/family education;Therapeutic exercise   OT Goals Acute Rehab OT Goals OT Goal Formulation: Patient unable to participate in goal setting Time For Goal Achievement: 09/06/11 Potential to Achieve Goals: Good ADL Goals Pt Will Perform Grooming: with min assist;Standing at sink ADL Goal: Grooming - Progress: Goal set today Pt Will Perform Upper Body Bathing: with min assist;Sitting, edge of bed;Sitting, chair ADL Goal: Upper Body Bathing - Progress: Goal set today Pt Will Perform Lower Body Bathing: with mod assist;Sit to  stand from chair;Sit to stand from bed ADL Goal: Lower Body Bathing - Progress: Goal set today Pt Will Transfer to Toilet: with mod assist;3-in-1;with DME;Ambulation ADL Goal: Toilet Transfer - Progress: Goal set today Pt Will Perform Toileting -  Clothing Manipulation: with mod assist;Standing ADL Goal: Toileting - Clothing Manipulation - Progress: Goal set today Additional ADL Goal #1: pt will be oriented to place and situation with min question cues ADL Goal: Additional Goal #1 - Progress: Goal set today Additional ADL Goal #2: pt will be able to fully flex and extend digits and verbalize need to elevate L UE on pillows with min cueing.  ADL Goal: Additional Goal #2 - Progress: Goal set today  Visit Information  Last OT Received On: 08/30/11 Assistance Needed: +2 PT/OT Co-Evaluation/Treatment: Yes    Subjective Data  Subjective: why should I do what you want me to do? Patient Stated Goal: none stated. pt confused and agitated   Prior Functioning  Home Living Lives With: Other (Comment) (was at independent living section at [facility]) Available Help at Discharge: Other (Comment);Skilled Nursing Facility (Will return to rehab section of [facility]) Type of Home: Skilled Nursing Facility Home Access: Level entry Home Adaptive Equipment: Walker - rolling Additional Comments: Difficult to get information from pt as she is not cooperative to answer questions. Per daughter who waS present at end of eval states pt took care of all of her ADLs Prior Function Level of Independence: Independent with assistive device(s) Driving: No Vocation: Retired Musician: No difficulties Dominant Hand: Right    Cognition  Overall Cognitive Status: Impaired Area of Impairment: Memory;Following commands;Safety/judgement;Awareness of errors;Awareness of deficits;Problem solving Arousal/Alertness: Awake/alert Orientation Level: Disoriented X4 Behavior During Session:  Agitated Following Commands: Follows one step commands inconsistently Safety/Judgement: Decreased awareness of safety precautions;Decreased safety judgement for tasks assessed Awareness of Errors: Assistance required to identify errors made;Assistance required to correct errors made Awareness of Errors - Other Comments: pt requires assist to steer RW to avoid bumping into the door    Extremity/Trunk Assessment Right Upper Extremity Assessment RUE ROM/Strength/Tone: Within functional levels Left Upper Extremity Assessment LUE ROM/Strength/Tone: Unable to fully assess LUE ROM/Strength/Tone Deficits: pt in soft splint. pt able to move digits but not through full range. Note edema in fingers and encouraged pt to flex and extend but required much encouragement to get her to move them even minimally. Elevated UE on pillows and encouraged daughter to remind pt to move digits.  Right Lower Extremity Assessment RLE ROM/Strength/Tone: WFL for tasks assessed RLE Sensation: WFL - Light Touch RLE Coordination: WFL - gross motor Left Lower Extremity Assessment LLE ROM/Strength/Tone: WFL for tasks assessed LLE Sensation: WFL - Light Touch LLE Coordination: WFL - gross motor Trunk Assessment Trunk Assessment: Kyphotic   Mobility Bed Mobility Bed Mobility: Supine to Sit Supine to Sit: 1: +2 Total assist;HOB elevated Supine to Sit: Patient Percentage: 50% Details for Bed Mobility Assistance: Requires assist for B LE off of bed with pt initiating movement towards EOB.  Also requires assist for trunk to sit on EOB.  Cues for technique and to attend to task.   Transfers Sit to Stand: 1: +2 Total assist;From elevated surface;With upper extremity assist;From bed Sit to Stand: Patient Percentage: 70% Stand to Sit: 1: +2 Total assist;With upper extremity assist;With armrests;To chair/3-in-1 Stand to Sit: Patient Percentage: 70% Details for Transfer Assistance: Assist for rising to standing position with cues  for hand placement and safety with LUE on platform.     Exercise    Balance    End of Session OT - End of Session Equipment Utilized During Treatment: Gait belt;Other (comment) (PFRW) Activity Tolerance: Treatment limited secondary to agitation Patient left: in bed;with call bell/phone within reach;with family/visitor present  Lennox Laity [phone] 08/30/2011, 12:29 PM

## 2011-08-30 NOTE — Progress Notes (Signed)
Patient ID: Debra Velasquez, female   DOB: Aug 27, 1912, 78 y.o.   MRN: [id number] .Marland KitchenSubjective: Patient tired this am, pain currently controlled. She denies n/v/f/c/sob.   Objective: Vital signs in last 24 hours: Temp:  [97.8 F (36.6 C)-98.4 F (36.9 C)] 98.2 F (36.8 C) (05/22 0937) Pulse Rate:  [68-88] 85  (05/22 0937) Resp:  [12-18] 12  (05/22 0937) BP: (159-191)/(62-88) 163/70 mmHg (05/22 0937) SpO2:  [94 %-99 %] 98 % (05/22 0937) Weight:  [55.339 kg (122 lb)] 55.339 kg (122 lb) (05/21 2209)  Intake/Output from previous day: 05/21 0701 - 05/22 0700 In: 240 [P.O.:240] Out: 550 [Urine:550] Intake/Output this shift:    No results found for this basename: HGB:5 in the last 72 hours No results found for this basename: WBC:2,RBC:2,HCT:2,PLT:2 in the last 72 hours No results found for this basename: NA:2,K:2,CL:2,CO2:2,BUN:2,CREATININE:2,GLUCOSE:2,CALCIUM:2 in the last 72 hours No results found for this basename: LABPT:2,INR:2 in the last 72 hours  .The patient is alert and oriented in no acute distress the patient complains of pain in the affected upper extremity. The patient is noted to have a normal HEENT exam. Lung fields show equal chest expansion and no shortness of breath abdomen exam is nontender without distention. Lower extremity examination does not show any fracture dislocation or blood clot symptoms. Pelvis is stable neck and back are stable and nontender LUE: dorsal swelling present about the hand, digital motion intact, sensation and refill intact, splint clean and dry  Assessment/Plan: S/P closed reduction Left distal radius and ulna  Unspecified Essential Hypertension  Old myocardial infarction  PAROXYSMAL ATRIAL FIBRILLATION  ACUTE SINUSITIS, UNSPECIFIED  ACUTE BRONCHITIS  ALLERGIC RHINITIS  ASTHMA  GERD  PEPTIC ULCER DISEASE  CONSTIPATION  UTI  CELLULITIS, LEGS  OSTEOARTHRITIS  BACK PAIN, THORACIC REGION  OSTEOPOROSIS  VERTIGO  INSOMNIA  ALTERED MENTAL  STATUS  DEPENDENT EDEMA, LEGS  SHORTNESS OF BREATH  URINARY INCONTINENCE  COMPRESSION FRACTURE, THORACIC VERTEBRA  CAD  COUGH   Continue strict elevation, pain control, close obs.. We may have to bivalve her splint tomorrow if edema persists. Plan for d/c tomorrow  Karie Chimera L 08/30/2011, 12:06 PM

## 2011-08-30 NOTE — Evaluation (Signed)
Physical Therapy Evaluation Patient Details Name: Debra Velasquez MRN: [id number] DOB: [birth year] Today's Date: 08/30/2011 Time: 4098-1191 PT Time Calculation (min): 44 min  PT Assessment / Plan / Recommendation Clinical Impression  Pt presents with distal radius and ulna fracture (FOOSH) following a fall when leaving independent living facility.  Tolerated some ambulation with L platform RW, however pt is very confused and agitated, which limited evaluation and treatment.  Daughter present for end of session, stating that she is normally not confused.  Pt will benefit from skilled PT in acute venue to address deficits.  PT recommends SNF for follow up thearpy at D/C to increase pt safety.      PT Assessment  Patient needs continued PT services    Follow Up Recommendations  Skilled nursing facility    Barriers to Discharge        lEquipment Recommendations  Defer to next venue    Recommendations for Other Services OT consult (already on board)   Frequency Min 3X/week    Precautions / Restrictions Precautions Precautions: Fall Restrictions Weight Bearing Restrictions: Yes LUE Weight Bearing: Non weight bearing Other Position/Activity Restrictions: used L platform RW   Pertinent Vitals/Pain Unable to state pain      Mobility  Bed Mobility Bed Mobility: Supine to Sit Supine to Sit: 1: +2 Total assist;HOB elevated Supine to Sit: Patient Percentage: 50% Details for Bed Mobility Assistance: Requires assist for B LE off of bed with pt initiating movement towards EOB.  Also requires assist for trunk to sit on EOB.  Cues for technique and to attend to task.   Transfers Transfers: Sit to Stand;Stand to Sit Sit to Stand: 1: +2 Total assist;From elevated surface;With upper extremity assist;From bed Sit to Stand: Patient Percentage: 70% Stand to Sit: 1: +2 Total assist;With upper extremity assist;With armrests;To chair/3-in-1 Stand to Sit: Patient Percentage: 70% Details for Transfer  Assistance: Assist for rising to standing position with cues for hand placement and safety with LUE on platform.   Ambulation/Gait Ambulation/Gait Assistance: 1: +2 Total assist Ambulation/Gait: Patient Percentage: 70% Ambulation Distance (Feet): 12 Feet (then another 12') Assistive device: Left platform walker Ambulation/Gait Assistance Details: Requires assist to steer RW throughout entire ambulation due to pt running into object and unable to negotiate/problem solve independently.  Cues for upright posture.  Gait Pattern: Step-through pattern;Decreased stride length;Trunk flexed;Narrow base of support Gait velocity: decreased Stairs: No Wheelchair Mobility Wheelchair Mobility: No    Exercises     PT Diagnosis: Difficulty walking;Generalized weakness;Acute pain  PT Problem List: Decreased strength;Decreased range of motion;Decreased activity tolerance;Decreased balance;Decreased mobility;Decreased knowledge of use of DME;Decreased safety awareness;Decreased knowledge of precautions PT Treatment Interventions: DME instruction;Gait training;Functional mobility training;Therapeutic activities;Therapeutic exercise;Balance training;Patient/family education   PT Goals Acute Rehab PT Goals PT Goal Formulation: With patient/family Time For Goal Achievement: 09/06/11 Potential to Achieve Goals: Fair Pt will go Sit to Supine/Side: with supervision PT Goal: Sit to Supine/Side - Progress: Goal set today Pt will go Sit to Stand: with supervision PT Goal: Sit to Stand - Progress: Goal set today Pt will go Stand to Sit: with supervision PT Goal: Stand to Sit - Progress: Goal set today Pt will Ambulate: 51 - 150 feet;with supervision;with least restrictive assistive device PT Goal: Ambulate - Progress: Goal set today  Visit Information  Last PT Received On: 08/30/11 Assistance Needed: +2 PT/OT Co-Evaluation/Treatment: Yes    Subjective Data  Subjective: Why should I do what you say? Patient  Stated Goal: Unable to state goal due  to increased confusion.    Prior Functioning  Home Living Lives With: Other (Comment) (was at independent living section at [facility]) Available Help at Discharge: Other (Comment);Skilled Nursing Facility (Will return to rehab section of [facility]) Type of Home: Skilled Nursing Facility Home Access: Level entry Home Adaptive Equipment: Walker - rolling Prior Function Level of Independence: Independent with assistive device(s) Driving: No Vocation: Retired Musician: No difficulties    Cognition  Overall Cognitive Status: Impaired Area of Impairment: Safety/judgement;Awareness of errors;Problem solving;Following commands Arousal/Alertness: Awake/alert Orientation Level: Disoriented X4 Behavior During Session: Agitated Following Commands: Follows one step commands inconsistently Safety/Judgement: Decreased awareness of safety precautions;Decreased safety judgement for tasks assessed Awareness of Errors: Assistance required to identify errors made;Assistance required to correct errors made Awareness of Errors - Other Comments: Pt requires assist for steering RW throughout entire ambulation.     Extremity/Trunk Assessment Left Upper Extremity Assessment LUE ROM/Strength/Tone: Unable to fully assess;Due to precautions;Due to pain;Due to impaired cognition (Pt finally able to wiggle fingers, not through full ROM) Right Lower Extremity Assessment RLE ROM/Strength/Tone: WFL for tasks assessed RLE Sensation: WFL - Light Touch RLE Coordination: WFL - gross motor Left Lower Extremity Assessment LLE ROM/Strength/Tone: WFL for tasks assessed LLE Sensation: WFL - Light Touch LLE Coordination: WFL - gross motor Trunk Assessment Trunk Assessment: Kyphotic   Balance    End of Session PT - End of Session Equipment Utilized During Treatment: Gait belt;Other (comment) (Pt has soft brace on LUE) Activity Tolerance: Treatment limited  secondary to agitation Patient left: in bed;with call bell/phone within reach;with family/visitor present Nurse Communication: Mobility status;Weight bearing status   Page, Meribeth Mattes 08/30/2011, 11:10 AM

## 2011-08-30 NOTE — Progress Notes (Signed)
CARE MANAGEMENT NOTE 08/30/2011  Patient:  Debra Velasquez, Debra Velasquez   Account Number:  [id number]  Date Initiated:  08/30/2011  Documentation initiated by:  Colleen Can  Subjective/Objective Assessment:   dx pt sustained left wrist fracture faollowing a fall  Closed reduction of fracture done in ED     Action/Plan:   CM spoke with patient's daughter. Plans are for patient to go to SNF rehab  Pt from independent living -[facility]   Anticipated DC Date:  08/31/2011   Anticipated DC Plan:  SKILLED NURSING FACILITY  In-house referral  Clinical Social Worker      DC Planning Services  CM consult      [facility] Choice  NA   Choice offered to / List presented to:  NA   DME arranged  NA      DME agency  NA     HH arranged  NA      HH agency  NA   Status of service:  Completed, signed off Medicare Important Message given?  NO (If response is "NO", the following Medicare IM given date fields will be blank)  Per UR Regulation:  Reviewed for med. necessity/level of care/duration of stay

## 2011-08-31 NOTE — Discharge Instructions (Signed)
..  Keep bandage clean and dry.  Call for any problems.  No smoking.  Criteria for driving a car: you should be off your pain medicine for 7-8 hours, able to drive one handed(confident), thinking clearly and feeling able in your judgement to drive. Continue elevation as it will decrease swelling.  If instructed by MD move your fingers within the confines of the bandage/splint.  Use ice if instructed by your MD. Call immediately for any sudden loss of feeling in your hand/arm or change in functional abilities of the extremity. Please keep the splint clean and dry do not remove, elevate the upper extremity above the heart frequently. It is imperative that the patient actively moves her fingers flexing and extending the fingers frequently 15-20 times an hour. Massage the fingers and dorsal aspect of the hand. Attention physical therapy and occupational therapy:  The patient will need a platform attachment for her existing walker she may weight bear through her forearm, however, no weightbearing about the hand and or wrist region. She will need gait training, assistance with elevation, edema control and digital range of motion, as well as assistance with activities of daily living. She may use a sling for comfort purposes that will not need this when she is in sedentary positions. Please contact our office for any questions or concerns regarding the patient. She will need to follow up this Monday for repeat radiographs and splint check.

## 2011-08-31 NOTE — Progress Notes (Signed)
Discharge summary sent to payer through [employer]  

## 2011-08-31 NOTE — Discharge Summary (Signed)
.. Physician Discharge Summary  Patient ID: SHAJUANA MCLUCAS MRN: [id number] DOB/AGE: 08/03/1912 78 y.o.  Admit date: 08/29/2011 Discharge date: 08/31/2011  Admission Diagnoses: Left distal radius and ulna fractures displaced and angulated Unspecified Essential Hypertension Old myocardial infarction PAROXYSMAL ATRIAL FIBRILLATION ACUTE SINUSITIS, UNSPECIFIED ACUTE BRONCHITIS ALLERGIC RHINITIS ASTHMA GERD PEPTIC ULCER DISEASE CONSTIPATION UTI CELLULITIS, LEGS OSTEOARTHRITIS BACK PAIN, THORACIC REGION OSTEOPOROSIS VERTIGO INSOMNIA ALTERED MENTAL STATUS DEPENDENT EDEMA, LEGS SHORTNESS OF BREATH URINARY INCONTINENCE COMPRESSION FRACTURE, THORACIC VERTEBRA CAD COUGH    Discharge Diagnoses: Status post closed reduction and splinting of the left distal radius and ulnar fracture See above   Discharged Condition: Stable, improved}  Hospital Course: The patient is a pleasant 78 year old female who unfortunately fell early Tuesday morning, injuring her left upper extremity. She was taken to the [facility] long emergency department where she was found to have a displaced distal radius and ulna fracture. The patient is a resident of [facility] home. The patient was seen and evaluated in the emergency room setting given the degree of her fracture process we were consulted for potential surgical intervention. After a thorough discussion with she and her daughter the decision was made to proceed with attempts at closed reduction and splinting. Given her age and multiple medical comorbidities the family on a trial call attempts at conservative care and certainly we are in agreement with this. We have counseled the family and the patient in regards to her fracture process and all issues including potential for progressive angulation, further displacement, chronic pain, stiffness, arthritis and the potential need for surgical intervention. The patient underwent a hematoma block to the left wrist region and closed  reduction measures were performed to the left distal radius and ulna she tolerated this very well. A well molded long-arm splint with a sugar tong modification was placed onto the upper extremity with an excellent 3 point mold. Post reduction films were reviewed and showed improved alignment in the AP and lateral planes. The decision was made to admit the patient for close observation, elevation edema control and pain management. Patient did very well throughout her hospital admit she had no complicating features her vital signs remained stable her pain was controlled well and eventually PO pain medications were only required. The patient underwent evaluation with physical therapy and occupational therapy for further need she may have. The decision was made given her recent fracture process unsteady gait and need for a platform walker a skilled nursing facility with extra assistance would be required as opposed to her independent living facility. A bed was available at [facility] in the skilled nursing facility. On hospital day #2 the patient was awake she was in a chair she was tolerating by mouth without difficulty she was voiding without difficulty and was noted to have flatus. Her pain was controlled. Her splint was loosened as she was noted to have increased swelling about the digits and dorsal hand. The splint was rewrapped she had no signs of neurovascular compromise. Decision was made to discharge her to [facility] in the skilled nursing facility.  The patient will need continued therapy, to include frequent elevation and edema control and range of motion attempts of the digits. It is imperative that she tries to frequently make a full fist and extend the fingers and in addition to massage the digits and dorsal hand to prevent further swelling. She'll need to keep her splint clean and dry he should not be removed she can weight-bear through her proximal  forearm and elbow region  however she should not weight-bear at the wrist or hand a platform walker will certainly help her with this. We will need to see her next Monday for repeat radiographs and a splint check.  Consults:  PT/OT    Treatments:  See Above  Discharge Exam: Blood pressure 153/66, pulse 79, temperature 98.8 F (37.1 C), temperature source Oral, resp. rate 14, height 5\' 8"  (1.727 m), weight 55.339 kg (122 lb), SpO2 95.00%. The patient is pleasant in no acute distress Patient presents for evaluation and treatment of the of their upper extremity predicament. The patient denies neck back chest or of abdominal pain. The patient notes that they have no lower extremity problems. The patient from primarily complains of the upper extremity pain noted. She was noted to have dorsal edema about the hand and digits, the splint was loosened without difficulties and rewrapped improving her swelling. She is neurovascularly intact without signs of compartment syndrome. The radial, median and ulnar nerves are intact. Sensation and refill are intact.  Disposition: SNF  Medication List  As of 08/31/2011  2:00 PM   STOP taking these medications         B-12 1000 MCG Caps         TAKE these medications         aspirin 81 MG tablet   Take 81 mg by mouth daily.      Co Q 10 100 MG Caps   Take by mouth daily.      fluticasone 27.5 MCG/SPRAY nasal spray   Commonly known as: VERAMYST   Place 2 sprays into the nose daily.      guaiFENesin 600 MG 12 hr tablet   Commonly known as: MUCINEX   Take 600 mg by mouth as needed.      HYDROcodone-acetaminophen 5-500 MG per tablet   Commonly known as: VICODIN   Take 1 tablet by mouth every 6 (six) hours as needed.      lisinopril 20 MG tablet   Commonly known as: PRINIVIL,ZESTRIL   Take 20 mg by mouth daily.      metoprolol succinate 50 MG 24 hr tablet   Commonly known as: TOPROL-XL   Take 50 mg by mouth daily.      nitroGLYCERIN 0.4 MG SL tablet   Commonly known  as: NITROSTAT   Place 0.4 mg under the tongue every 5 (five) minutes as needed. 1 tab under tongue at onset of chest pain; you may repeat every 5 minutes for up to 3 doses      omeprazole 40 MG capsule   Commonly known as: PRILOSEC   TAKE 1 CAPSULE DAILY      tolterodine 4 MG 24 hr capsule   Commonly known as: DETROL LA   Take 4 mg by mouth daily.      VENTOLIN HFA 108 (90 BASE) MCG/ACT inhaler   Generic drug: albuterol   Inhale 2 puffs into the lungs every 4 (four) hours as needed.           Follow-up Information    Follow up with Karen Chafe, MD. Schedule an appointment as soon as possible for a visit in 4 days. (please call [phone] to schedule followup appt this  monday)    Contact information:   [address] [place] [address] [phone]          Signed: Sheran Lawless 08/31/2011, 2:00 PM

## 2011-09-01 NOTE — Progress Notes (Signed)
Clinical Social Work Department CLINICAL SOCIAL WORK PLACEMENT NOTE 09/01/2011  Patient:  Debra Velasquez, Debra Velasquez  Account Number:  [id number] Admit date:  08/29/2011  Clinical Social Worker:  Tommi Emery, CLINICAL SOCIAL WORKER  Date/time:  09/01/2011 08:11 AM  Clinical Social Work is seeking post-discharge placement for this patient at the following level of care:   SKILLED NURSING   (*CSW will update this form in Epic as items are completed)   08/28/2011  Patient/family provided with [facility] Department of Clinical Social Work's list of facilities offering this level of care within the geographic area requested by the patient (or if unable, by the patient's family).  08/28/2011  Patient/family informed of their freedom to choose among providers that offer the needed level of care, that participate in Medicare, Medicaid or managed care program needed by the patient, have an available bed and are willing to accept the patient.  08/28/2011  Patient/family informed of [facility]' ownership interest in [facility], as well as of the fact that they are under no obligation to receive care at this facility.  PASARR submitted to [employer]DS on 08/28/2011 PASARR number received from [employer] on 08/28/2011  FL2 transmitted to all facilities in geographic area requested by pt/family on  08/28/2011 FL2 transmitted to all facilities within larger geographic area on 08/28/2011  Patient informed that his/her managed care company has contracts with or will negotiate with  certain facilities, including the following:     Patient/family informed of bed offers received:  08/28/2011 Patient chooses bed at [facility] Physician recommends and patient chooses bed at  [facility]  Patient to be transferred to [facility] on  08/31/2011 Patient to be transferred to facility by [facility]  The following physician request were entered in Epic:   Additional  Comments:  Prayan Ulin C. Leighton Ruff [phone]

## 2011-09-01 NOTE — Progress Notes (Signed)
Pt for D/C today to [facility]. Plan transport via EMS. Pt and family are agreeable to plans.  Arul Farabee C. Talene Glastetter, LCSWA [phone]   

## 2011-12-07 ENCOUNTER — Other Ambulatory Visit: Payer: Self-pay | Admitting: Neurosurgery

## 2011-12-07 DIAGNOSIS — M545 Low back pain: Secondary | ICD-10-CM

## 2011-12-07 DIAGNOSIS — M546 Pain in thoracic spine: Secondary | ICD-10-CM

## 2011-12-18 ENCOUNTER — Ambulatory Visit
Admission: RE | Admit: 2011-12-18 | Discharge: 2011-12-18 | Disposition: A | Discharge status: Home / Self Care | Payer: Medicare Other | Source: Ambulatory Visit | Attending: Neurosurgery | Admitting: Neurosurgery

## 2011-12-18 DIAGNOSIS — M546 Pain in thoracic spine: Secondary | ICD-10-CM

## 2011-12-18 DIAGNOSIS — M545 Low back pain, unspecified: Secondary | ICD-10-CM

## 2012-03-01 ENCOUNTER — Other Ambulatory Visit: Payer: Self-pay | Admitting: Neurosurgery

## 2012-04-26 ENCOUNTER — Other Ambulatory Visit (HOSPITAL_COMMUNITY): Payer: Medicare Other

## 2012-04-30 ENCOUNTER — Inpatient Hospital Stay: Admit: 2012-04-30 | Payer: Self-pay | Admitting: Neurosurgery

## 2012-04-30 SURGERY — KYPHOPLASTY
Anesthesia: General

## 2013-04-23 ENCOUNTER — Telehealth: Payer: Self-pay

## 2013-04-23 NOTE — Telephone Encounter (Signed)
[facility] [employer]

## 2013-05-11 DEATH — deceased
